# Patient Record
Sex: Female | Born: 1980 | Race: Black or African American | Hispanic: No | Marital: Married | Smoking: Current every day smoker
Health system: Southern US, Community
[De-identification: ages and names within clinical notes are randomized; demographics above are authoritative.]

## PROBLEM LIST (undated history)

## (undated) HISTORY — PX: TUBAL LIGATION: SHX77

---

## 2005-07-23 ENCOUNTER — Ambulatory Visit: Payer: Self-pay | Admitting: *Deleted

## 2005-07-30 ENCOUNTER — Ambulatory Visit (HOSPITAL_COMMUNITY): Admission: RE | Admit: 2005-07-30 | Discharge: 2005-07-30 | Payer: Self-pay | Admitting: *Deleted

## 2005-07-30 ENCOUNTER — Ambulatory Visit: Payer: Self-pay | Admitting: *Deleted

## 2005-08-13 ENCOUNTER — Ambulatory Visit: Payer: Self-pay | Admitting: Obstetrics & Gynecology

## 2005-08-26 ENCOUNTER — Ambulatory Visit: Payer: Self-pay | Admitting: *Deleted

## 2005-09-08 ENCOUNTER — Inpatient Hospital Stay (HOSPITAL_COMMUNITY): Admission: AD | Admit: 2005-09-08 | Discharge: 2005-09-08 | Payer: Self-pay | Admitting: Obstetrics & Gynecology

## 2005-09-08 ENCOUNTER — Ambulatory Visit: Payer: Self-pay | Admitting: Obstetrics & Gynecology

## 2005-09-10 ENCOUNTER — Ambulatory Visit: Payer: Self-pay | Admitting: *Deleted

## 2005-09-11 ENCOUNTER — Inpatient Hospital Stay (HOSPITAL_COMMUNITY): Admission: AD | Admit: 2005-09-11 | Discharge: 2005-09-14 | Payer: Self-pay | Admitting: Obstetrics & Gynecology

## 2005-09-11 ENCOUNTER — Ambulatory Visit: Payer: Self-pay | Admitting: Family Medicine

## 2011-01-19 ENCOUNTER — Encounter: Payer: Self-pay | Admitting: *Deleted

## 2012-05-21 ENCOUNTER — Other Ambulatory Visit (HOSPITAL_COMMUNITY): Payer: Self-pay | Admitting: Specialist

## 2012-05-27 ENCOUNTER — Ambulatory Visit (HOSPITAL_COMMUNITY)
Admission: RE | Admit: 2012-05-27 | Discharge: 2012-05-27 | Disposition: A | Discharge status: Home / Self Care | Payer: Medicaid Other | Source: Ambulatory Visit | Attending: Specialist | Admitting: Specialist

## 2012-05-27 ENCOUNTER — Encounter (HOSPITAL_COMMUNITY): Payer: Self-pay

## 2012-05-27 DIAGNOSIS — O358XX0 Maternal care for other (suspected) fetal abnormality and damage, not applicable or unspecified: Secondary | ICD-10-CM | POA: Insufficient documentation

## 2012-05-27 DIAGNOSIS — E669 Obesity, unspecified: Secondary | ICD-10-CM | POA: Insufficient documentation

## 2012-05-27 DIAGNOSIS — O30009 Twin pregnancy, unspecified number of placenta and unspecified number of amniotic sacs, unspecified trimester: Secondary | ICD-10-CM | POA: Insufficient documentation

## 2012-05-27 DIAGNOSIS — O139 Gestational [pregnancy-induced] hypertension without significant proteinuria, unspecified trimester: Secondary | ICD-10-CM | POA: Insufficient documentation

## 2012-05-27 NOTE — Progress Notes (Signed)
Patient seen today  for follow up ultrasound.  See full report in [facility].  Kimiya Brunelle, MD   

## 2013-08-10 IMAGING — US US OB COMP EACH ADDL GEST+14 WKS
1 series · 16 of 28 positions shown · non-contrast
Comparison: none

[Series 1: us ob comp each addl gest+14 wks · 0.28mm/px · 63 acquisitions, 16 frames shown]
[im 1/63]
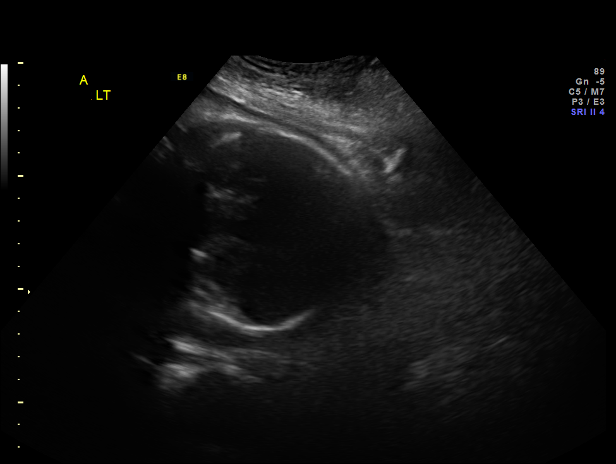
[im 5/63]
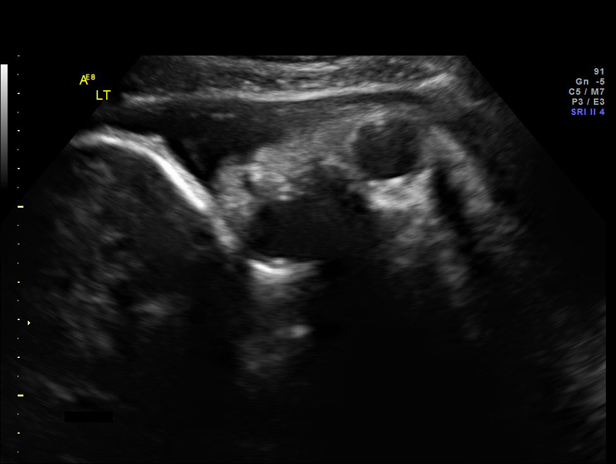
[im 10/63]
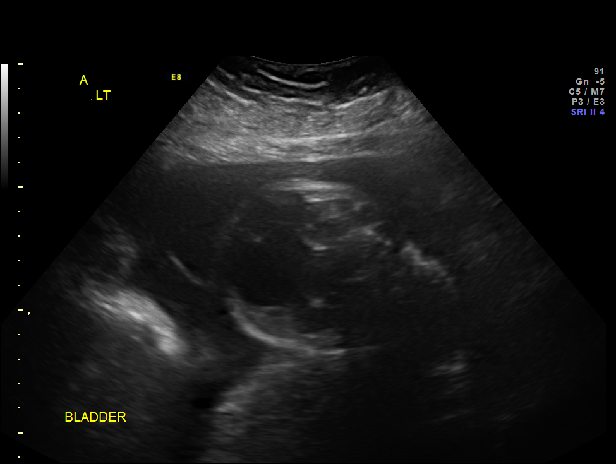
[im 14/63]
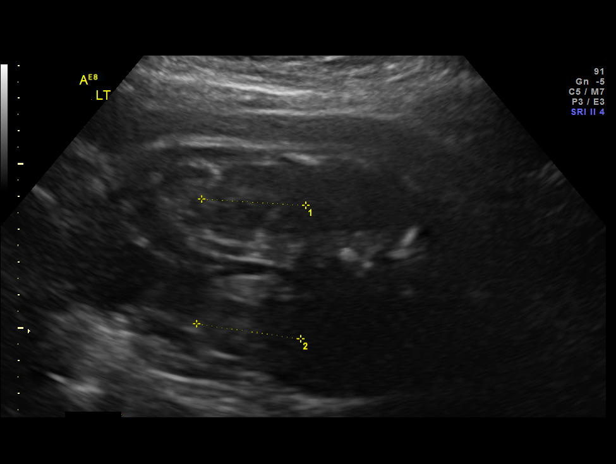
[im 17/63]
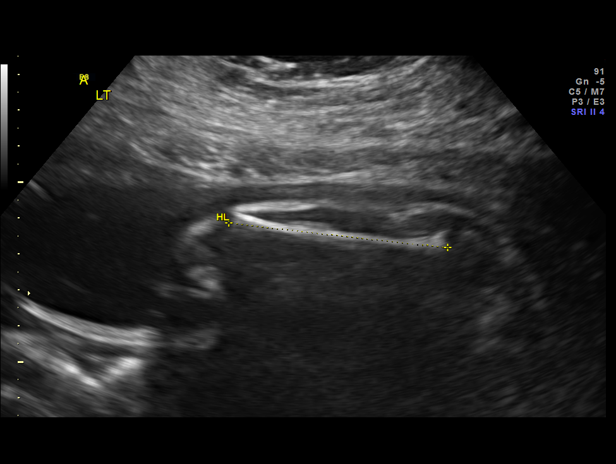
[im 21/63]
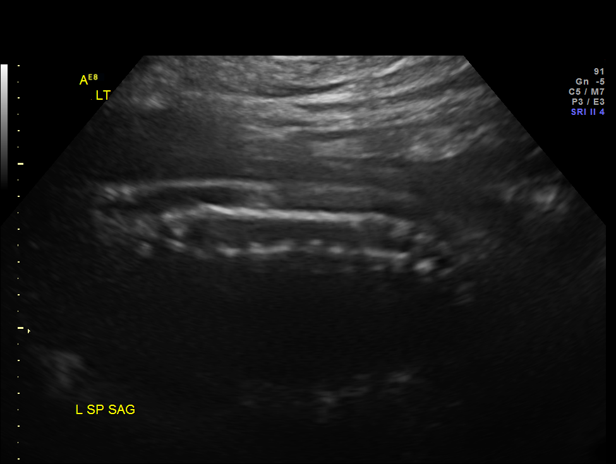
[im 26/63]
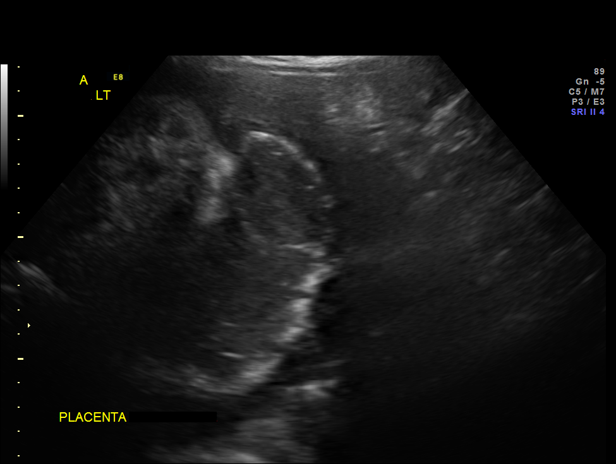
[im 30/63]
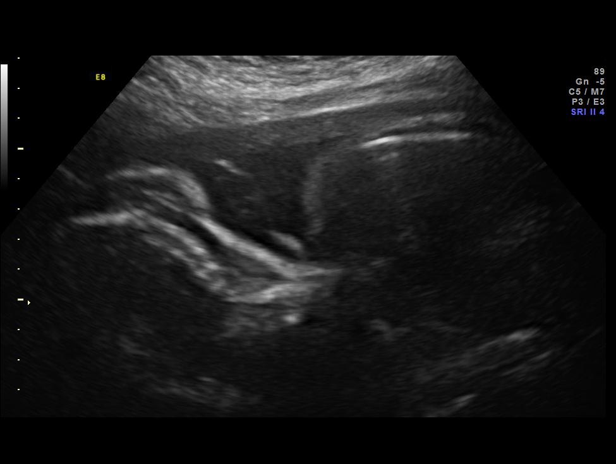
[im 33/63]
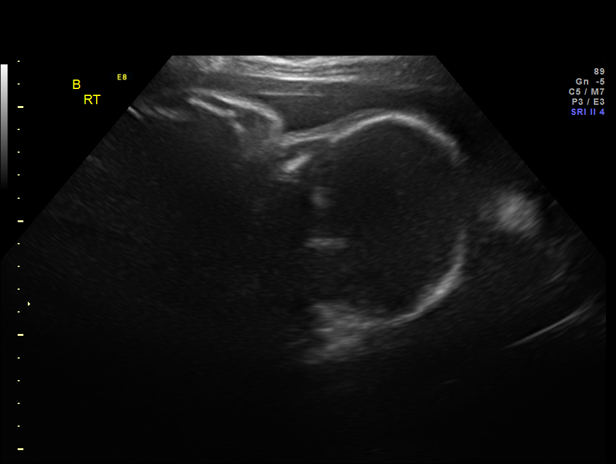
[im 37/63]
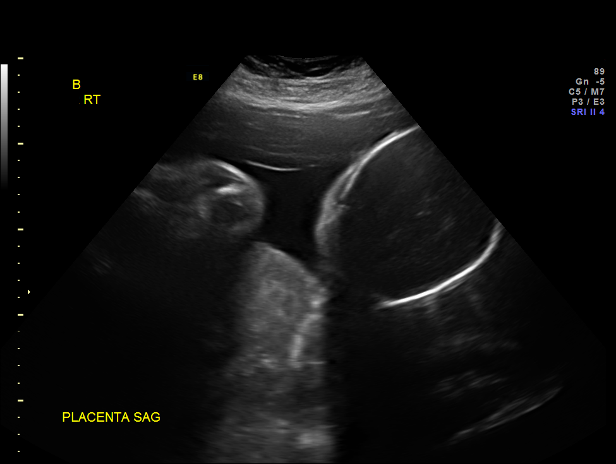
[im 42/63]
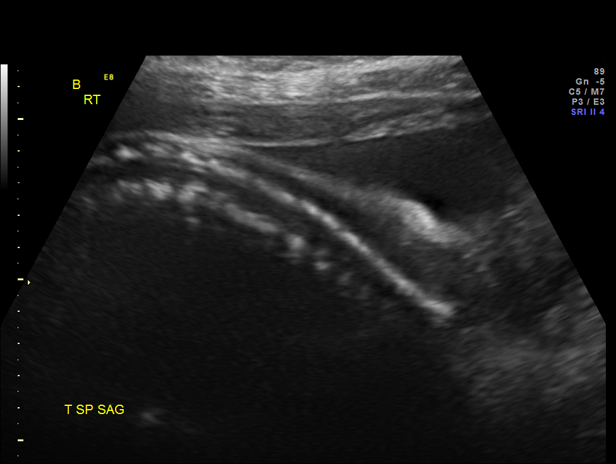
[im 46/63]
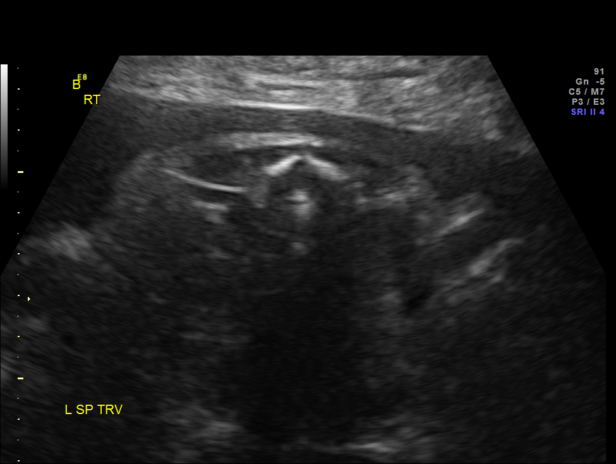
[im 49/63]
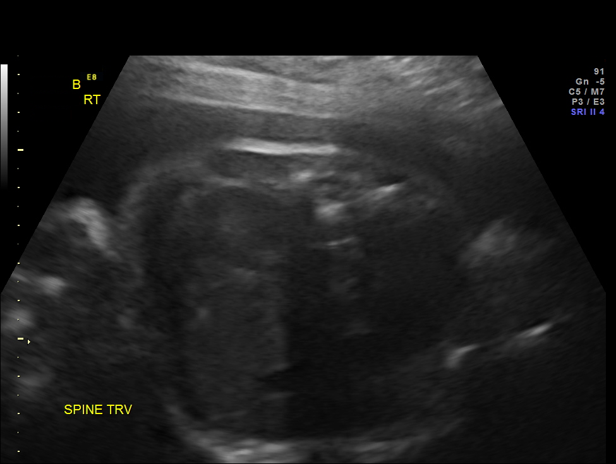
[im 53/63]
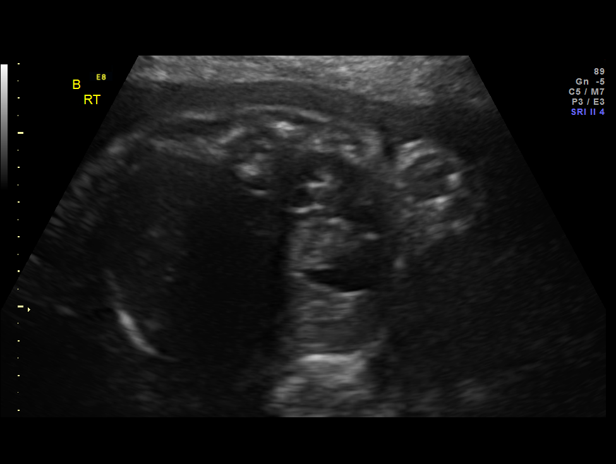
[im 58/63]
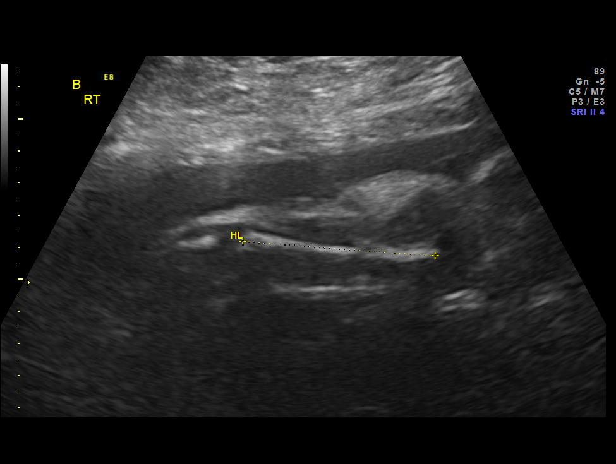
[im 63/63]
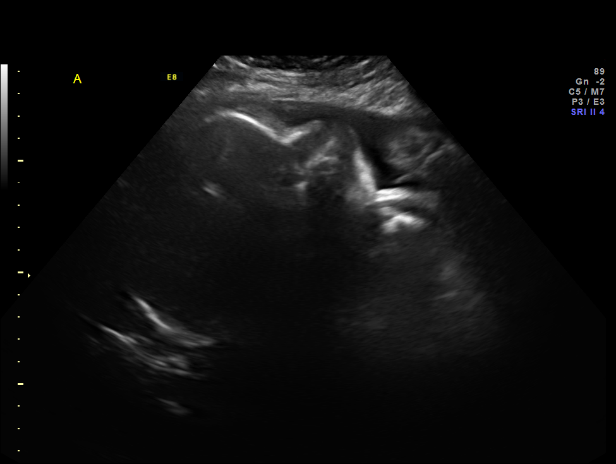

[16 of 28 positions shown; findings below may reference images not displayed]

OBSTETRICS REPORT
                      (Signed Final 05/27/2012 [DATE])

 Order#:         [id number]
                 75_O
Procedures

 US OB COMP + 14 WK                                    76805.1
 US OB COMP ADDL GEST + 14 WK                          76810.1
Indications

 Twin gestation, Di-Di
 Discordant Growth
 Hypertension - Gestational
 Obesity (376 lb)
 Cannabis abuse
 Cigarette smoker
 RH Negative
 Echogenic focus in heart (Twin A)
 Assess Fetal Growth / Estimated Fetal Weight
Fetal Evaluation (Fetus A)

 Fetal Heart Rate:  135                          bpm
 Cardiac Activity:  Observed
 Fetal Lie:         Lower leftFetus
 Presentation:      Cephalic
 Placenta:          Posterior

 Membrane Desc:     Dividing
                    Membrane seen
                    - Dichorionic.

 Amniotic Fluid
 AFI FV:      Subjectively within normal limits
                                             Larg Pckt:     5.0  cm
Biometry (Fetus A)

 BPD:     82.1  mm     G. Age:  33w 0d                CI:         76.1   70 - 86
 OFD:    107.9  mm                                    FL/HC:      21.0   20.1 -

 HC:     307.3  mm     G. Age:  34w 2d        9  %    HC/AC:      1.04   0.93 -

 AC:       295  mm     G. Age:  33w 4d       20  %    FL/BPD:     78.7   71 - 87
 FL:      64.6  mm     G. Age:  33w 2d       10  %    FL/AC:      21.9   20 - 24
 HUM:     60.6  mm     G. Age:  35w 1d       70  %

 Est. FW:    8823  gm    4 lb 14 oz      34  %     FW Discordancy         9  %
Gestational Age (Fetus A)

 LMP:           34w 6d        Date:  09/26/11                 EDD:   07/02/12
 U/S Today:     33w 4d                                        EDD:   07/11/12
 Best:          34w 6d     Det. By:  LMP  (09/26/11)          EDD:   07/02/12
Anatomy (Fetus A)

 Cranium:           Appears normal      Stomach:           Appears
                                                           normal, left
                                                           sided
 Ventricles:        Appears normal      Abdomen:           Appears normal
 Nuchal Fold:       Not applicable      Cord Vessels:      Appears normal
                    (>20 wks GA)                           (3 vessel cord)
 Face:              Appears normal      Kidneys:           Appear normal
                    (lips/profile/orbit
                    s)
 Heart:             Not well            Bladder:           Appears normal
                    visualized
                    today, EIF prev
                    seen
 Diaphragm:         Appears normal      Spine:             Limited views
                                                           appear normal

 Other:     Technically difficult due to advanced GA and fetal
            position.

Fetal Evaluation (Fetus B)

 Fetal Heart Rate:  127                          bpm
 Cardiac Activity:  Observed
 Fetal Lie:         Upper right Fetus
 Presentation:      Cephalic
 Placenta:          Posterior

 Membrane Desc:     Dividing
                    Membrane seen
                    - Dichorionic.

 Amniotic Fluid
 AFI FV:      Subjectively within normal limits
                                             Larg Pckt:     5.7  cm
Biometry (Fetus B)

 BPD:     85.9  mm     G. Age:  34w 5d                CI:         77.9   70 - 86
 OFD:    110.3  mm                                    FL/HC:      21.4   20.1 -

 HC:     312.6  mm     G. Age:  35w 0d       21  %    HC/AC:      1.03   0.93 -

 AC:     302.6  mm     G. Age:  34w 2d       38  %    FL/BPD:     77.8   71 - 87
 FL:      66.8  mm     G. Age:  34w 3d       30  %    FL/AC:      22.1   20 - 24
 HUM:       59  mm     G. Age:  34w 1d       52  %

 Est. FW:    8972  gm      5 lb 5 oz     51  %     FW Discordancy      0 \ 9 %
Gestational Age (Fetus B)

 LMP:           34w 6d        Date:  09/26/11                 EDD:   07/02/12
 U/S Today:     34w 4d                                        EDD:   07/04/12
 Best:          34w 6d     Det. By:  LMP  (09/26/11)          EDD:   07/02/12
Anatomy (Fetus B)

 Cranium:           Appears normal      Abdomen:           Appears normal
 Ventricles:        Appears normal      Cord Vessels:      Appears normal
                                                           (3 vessel cord)
 Face:              Not well            Kidneys:           Appear normal
                    visualized
 Heart:             Not well            Bladder:           Appears normal
                    visualized
 Stomach:           Appears             Spine:             Appears normal
                    normal, left
                    sided

 Other:     Technically difficult due to advanced GA and fetal
            position.
Cervix Uterus Adnexa

 Cervix:       Not visualized (advanced GA >29 wks)
Impression

 DC/DA twins with best dates of 34 [DATE] weeks.  Fetal growth is
 appropriate and concordant (9% growth discordance noted).
 Normal amniotic fluid volume x 2

 A: maternal left, cephalic, posterior placenta.  EFW 8823 g
 (34th %)

 B: maternal right, cephalic, posterior placenta .  EFW 8972
 (51st %)
Recommendations

 Recommend follow up ultrasound as  as clinically indicated.

 questions or concerns.

## 2014-10-30 ENCOUNTER — Encounter (HOSPITAL_COMMUNITY): Payer: Self-pay

## 2017-10-17 ENCOUNTER — Emergency Department (HOSPITAL_BASED_OUTPATIENT_CLINIC_OR_DEPARTMENT_OTHER)
Admission: EM | Admit: 2017-10-17 | Discharge: 2017-10-17 | Disposition: A | Discharge status: Home / Self Care | Payer: Self-pay | Attending: Emergency Medicine | Admitting: Emergency Medicine

## 2017-10-17 ENCOUNTER — Encounter (HOSPITAL_BASED_OUTPATIENT_CLINIC_OR_DEPARTMENT_OTHER): Payer: Self-pay | Admitting: Emergency Medicine

## 2017-10-17 ENCOUNTER — Emergency Department (HOSPITAL_BASED_OUTPATIENT_CLINIC_OR_DEPARTMENT_OTHER): Payer: Self-pay

## 2017-10-17 DIAGNOSIS — Z79899 Other long term (current) drug therapy: Secondary | ICD-10-CM | POA: Insufficient documentation

## 2017-10-17 DIAGNOSIS — B349 Viral infection, unspecified: Secondary | ICD-10-CM | POA: Insufficient documentation

## 2017-10-17 DIAGNOSIS — F17210 Nicotine dependence, cigarettes, uncomplicated: Secondary | ICD-10-CM | POA: Insufficient documentation

## 2017-10-17 MED ORDER — PROMETHAZINE-DM 6.25-15 MG/5ML PO SYRP: 5.0000 mL | ORAL_SOLUTION | Freq: Four times a day (QID) | ORAL | 0 refills | Status: AC | PRN

## 2017-10-17 MED ORDER — HYDROCODONE-ACETAMINOPHEN 7.5-325 MG/15ML PO SOLN
10.0000 mL | Freq: Once | ORAL | Status: AC
Start: 2017-10-17 — End: 2017-10-17
  Administered 2017-10-17: 10 mL via ORAL
  Filled 2017-10-17: qty 15

## 2017-10-17 NOTE — Discharge Instructions (Signed)
Rest. Push fluids. Motrin or Tylenol for body aches. Phenergan-DM for cough. This can cause sleepiness.

## 2017-10-17 NOTE — ED Triage Notes (Signed)
PT presents with c/o cold and flu like symptoms for the past 2 days.

## 2017-10-17 NOTE — ED Provider Notes (Signed)
[facility] EMERGENCY DEPARTMENT Provider Note   CSN: [id number] Arrival date & time: 10/17/17  1624     History   Chief Complaint Chief Complaint  Patient presents with  . Influenza    HPI Michelle Baldwin is a 36 y.o. female. Chief complaint is cough, headache, chest tightness, fever.  HPI 36 year old female. Symptoms started 2 days ago. Cough. Headache. That she feels "hot" but checked temperature at home and found it normal. Presents here for evaluation. No chest pain. Short of breath only with cough. Dry nonproductive. No hemoptysis. Does have muscle aches. No sinus pain or pressure. No ear pain. No GI complaints. Diffuse myalgias.  History reviewed. No pertinent past medical history.  There are no active problems to display for this patient.   History reviewed. No pertinent surgical history.  OB History    Gravida Para Term Preterm AB Living   3 2       2    SAB TAB Ectopic Multiple Live Births                   Home Medications    Prior to Admission medications   Medication Sig Start Date End Date Taking? Authorizing Provider  methyldopa (ALDOMET) 500 MG tablet Take 500 mg by mouth 2 (two) times daily.    [provider]  promethazine-dextromethorphan (PROMETHAZINE-DM) 6.25-15 MG/5ML syrup Take 5 mLs by mouth 4 (four) times daily as needed for cough. 10/17/17   Rolland PorterJames, Reginold Beale, MD    Family History No family history on file.  Social History Social History  Substance Use Topics  . Smoking status: Current Every Day Smoker  . Smokeless tobacco: Never Used  . Alcohol use No     Allergies   Patient has no known allergies.   Review of Systems Review of Systems  Constitutional: Positive for fever. Negative for appetite change, chills, diaphoresis and fatigue.  HENT: Negative for mouth sores, sore throat and trouble swallowing.   Eyes: Negative for visual disturbance.  Respiratory: Positive for cough. Negative for chest tightness,  shortness of breath and wheezing.   Cardiovascular: Negative for chest pain.  Gastrointestinal: Negative for abdominal distention, abdominal pain, diarrhea, nausea and vomiting.  Endocrine: Negative for polydipsia, polyphagia and polyuria.  Genitourinary: Negative for dysuria, frequency and hematuria.  Musculoskeletal: Positive for myalgias. Negative for gait problem.  Skin: Negative for color change, pallor and rash.  Neurological: Positive for headaches. Negative for dizziness, syncope and light-headedness.  Hematological: Does not bruise/bleed easily.  Psychiatric/Behavioral: Negative for behavioral problems and confusion.     Physical Exam Updated Vital Signs BP 127/72   Pulse 83   Temp 98.6 F (37 C) (Oral)   Resp 18   LMP 10/10/2017   SpO2 95%   Breastfeeding? Unknown   Physical Exam  Constitutional: She is oriented to person, place, and time. She appears well-developed and well-nourished. No distress.  Awake and alert. No distress. Frequent cough. No dyspnea conversation.  HENT:  Head: Normocephalic.  Normal pharynx. TMs normal. No adenopathy in the neck.  Eyes: Pupils are equal, round, and reactive to light. Conjunctivae are normal. No scleral icterus.  Neck: Normal range of motion. Neck supple. No thyromegaly present.  Cardiovascular: Normal rate and regular rhythm.  Exam reveals no gallop and no friction rub.   No murmur heard. Pulmonary/Chest: Effort normal and breath sounds normal. No respiratory distress. She has no wheezes. She has no rales.  Clear breath sounds. Frequent cough.  Abdominal: Soft. Bowel  sounds are normal. She exhibits no distension. There is no tenderness. There is no rebound.  Musculoskeletal: Normal range of motion.  Neurological: She is alert and oriented to person, place, and time.  Skin: Skin is warm and dry. No rash noted.  Psychiatric: She has a normal mood and affect. Her behavior is normal.     ED Treatments / Results  Labs (all  labs ordered are listed, but only abnormal results are displayed) Labs Reviewed - No data to display  EKG  EKG Interpretation None       Radiology Dg Chest 2 View  Result Date: 10/17/2017 CLINICAL DATA:  Cough and fever EXAM: CHEST  2 VIEW COMPARISON:  None. FINDINGS: The heart size and mediastinal contours are within normal limits. Both lungs are clear. The visualized skeletal structures are unremarkable. IMPRESSION: No active cardiopulmonary disease. Electronically Signed   By: Alcide Clever M.D.   On: 10/17/2017 19:32    Procedures Procedures (including critical care time)  Medications Ordered in ED Medications  HYDROcodone-acetaminophen (HYCET) 7.5-325 mg/15 ml solution 10 mL (10 mLs Oral Given 10/17/17 1926)     Initial Impression / Assessment and Plan / ED Course  I have reviewed the triage vital signs and the nursing notes.  Pertinent labs & imaging results that were available during my care of the patient were reviewed by me and considered in my medical decision making (see chart for details).   normal chest x-ray. Not hypoxemic. Clear lungs. No symptoms or findings to suggest acute upper to bacterial infection. Plan will be symptomatic treatment.    Final Clinical Impressions(s) / ED Diagnoses   Final diagnoses:  Viral syndrome    New Prescriptions Discharge Medication List as of 10/17/2017  8:41 PM    START taking these medications   Details  promethazine-dextromethorphan (PROMETHAZINE-DM) 6.25-15 MG/5ML syrup Take 5 mLs by mouth 4 (four) times daily as needed for cough., Starting Sat 10/17/2017, Print         Rolland Porter, MD 10/17/17 [phone]

## 2020-03-16 ENCOUNTER — Other Ambulatory Visit: Payer: Self-pay

## 2020-03-16 ENCOUNTER — Emergency Department (HOSPITAL_BASED_OUTPATIENT_CLINIC_OR_DEPARTMENT_OTHER)
Admission: EM | Admit: 2020-03-16 | Discharge: 2020-03-16 | Disposition: A | Discharge status: Home / Self Care | Payer: BLUE CROSS/BLUE SHIELD | Attending: Emergency Medicine | Admitting: Emergency Medicine

## 2020-03-16 ENCOUNTER — Encounter (HOSPITAL_BASED_OUTPATIENT_CLINIC_OR_DEPARTMENT_OTHER): Payer: Self-pay | Admitting: *Deleted

## 2020-03-16 DIAGNOSIS — Z79899 Other long term (current) drug therapy: Secondary | ICD-10-CM | POA: Insufficient documentation

## 2020-03-16 DIAGNOSIS — M545 Low back pain, unspecified: Secondary | ICD-10-CM

## 2020-03-16 DIAGNOSIS — F17210 Nicotine dependence, cigarettes, uncomplicated: Secondary | ICD-10-CM | POA: Diagnosis not present

## 2020-03-16 DIAGNOSIS — N390 Urinary tract infection, site not specified: Secondary | ICD-10-CM | POA: Insufficient documentation

## 2020-03-16 LAB — URINALYSIS, MICROSCOPIC (REFLEX)

## 2020-03-16 LAB — URINALYSIS, ROUTINE W REFLEX MICROSCOPIC
Bilirubin Urine: NEGATIVE
Glucose, UA: NEGATIVE mg/dL
Ketones, ur: NEGATIVE mg/dL
Leukocytes,Ua: NEGATIVE
Nitrite: NEGATIVE
Protein, ur: NEGATIVE mg/dL
Specific Gravity, Urine: >1.03 — ABNORMAL HIGH (ref 1.005–1.030)
pH: 5.5 (ref 5.0–8.0)

## 2020-03-16 MED ORDER — DICLOFENAC SODIUM 75 MG PO TBEC: 75.0000 mg | DELAYED_RELEASE_TABLET | Freq: Two times a day (BID) | ORAL | 0 refills | Status: DC

## 2020-03-16 MED ORDER — CEPHALEXIN 500 MG PO CAPS: 500.0000 mg | ORAL_CAPSULE | Freq: Four times a day (QID) | ORAL | 0 refills | Status: AC

## 2020-03-16 NOTE — ED Triage Notes (Signed)
Back pain for a week. No known injury. No urinary complaints. She was recently evaluated by her MD for abnormal vaginal bleeding that has stopped since treatment.

## 2020-03-16 NOTE — ED Provider Notes (Signed)
[facility] EMERGENCY DEPARTMENT Provider Note   CSN: [id number] Arrival date & time: 03/16/20  1539     History Chief Complaint  Patient presents with  . Back Pain    Michelle Baldwin is a 39 y.o. female.  The history is provided by the patient. No language interpreter was used.  Back Pain Location:  Generalized Quality:  Aching Pain severity:  Moderate Onset quality:  Gradual Timing:  Constant Progression:  Worsening Chronicity:  New Context: not recent injury   Relieved by:  Nothing Worsened by:  Nothing Ineffective treatments:  None tried Associated symptoms: no fever    Pt complains of pain in her low back.  Pt reports pain is worse with a deep breath and with standing.  Pt denies any injury     History reviewed. No pertinent past medical history.  There are no problems to display for this patient.   History reviewed. No pertinent surgical history.   OB History    Gravida  3   Para  2   Term      Preterm      AB      Living  2     SAB      TAB      Ectopic      Multiple      Live Births              No family history on file.  Social History   Tobacco Use  . Smoking status: Current Every Day Smoker  . Smokeless tobacco: Never Used  Substance Use Topics  . Alcohol use: No  . Drug use: No    Home Medications Prior to Admission medications   Medication Sig Start Date End Date Taking? Authorizing Provider  cephALEXin (KEFLEX) 500 MG capsule Take 1 capsule (500 mg total) by mouth 4 (four) times daily for 10 days. 03/16/20 03/26/20  Elson Areas, PA-C  diclofenac (VOLTAREN) 75 MG EC tablet Take 1 tablet (75 mg total) by mouth 2 (two) times daily. 03/16/20   Elson Areas, PA-C  methyldopa (ALDOMET) 500 MG tablet Take 500 mg by mouth 2 (two) times daily.    [provider]  promethazine-dextromethorphan (PROMETHAZINE-DM) 6.25-15 MG/5ML syrup Take 5 mLs by mouth 4 (four) times daily as needed for cough. 10/17/17    Rolland Porter, MD    Allergies    Patient has no known allergies.  Review of Systems   Review of Systems  Constitutional: Negative for fever.  Musculoskeletal: Positive for back pain.  All other systems reviewed and are negative.   Physical Exam Updated Vital Signs BP 120/86   Pulse 73   Temp 98 F (36.7 C) (Oral)   Resp 18   Ht 5' 5.5" (1.664 m)   Wt (!) 170.6 kg   SpO2 100%   BMI 61.62 kg/m   Physical Exam Vitals and nursing note reviewed.  Constitutional:      Appearance: She is well-developed.  HENT:     Head: Normocephalic.  Cardiovascular:     Rate and Rhythm: Normal rate.  Pulmonary:     Effort: Pulmonary effort is normal.  Abdominal:     General: There is no distension.  Musculoskeletal:        General: Normal range of motion.     Cervical back: Normal range of motion.     Comments: Tender bilat flank area,  ls spine nontender   Skin:    General: Skin is  warm.  Neurological:     Mental Status: She is alert and oriented to person, place, and time.  Psychiatric:        Mood and Affect: Mood normal.     ED Results / Procedures / Treatments   Labs (all labs ordered are listed, but only abnormal results are displayed) Labs Reviewed  URINALYSIS, ROUTINE W REFLEX MICROSCOPIC - Abnormal; Notable for the following components:      Result Value   Specific Gravity, Urine >1.030 (*)    Hgb urine dipstick TRACE (*)    All other components within normal limits  URINALYSIS, MICROSCOPIC (REFLEX) - Abnormal; Notable for the following components:   Bacteria, UA MANY (*)    All other components within normal limits    EKG None  Radiology No results found.  Procedures Procedures (including critical care time)  Medications Ordered in ED Medications - No data to display  ED Course  I have reviewed the triage vital signs and the nursing notes.  Pertinent labs & imaging results that were available during my care of the patient were reviewed by me and  considered in my medical decision making (see chart for details).    MDM Rules/Calculators/A&P                      MDM Ua shows bacteri and wbc's  Pt given rx for diflucan and keflex Final Clinical Impression(s) / ED Diagnoses Final diagnoses:  Acute bilateral low back pain, unspecified whether sciatica present  Urinary tract infection without hematuria, site unspecified    Rx / DC Orders ED Discharge Orders         Ordered    diclofenac (VOLTAREN) 75 MG EC tablet  2 times daily     03/16/20 1635    cephALEXin (KEFLEX) 500 MG capsule  4 times daily     03/16/20 1635        An After Visit Summary was printed and given to the patient.    Fransico Meadow, [place] 03/16/20 1643    Dorie Rank, MD 03/17/20 1213

## 2020-03-16 NOTE — Discharge Instructions (Addendum)
Return if any problems.

## 2020-03-31 ENCOUNTER — Emergency Department (HOSPITAL_BASED_OUTPATIENT_CLINIC_OR_DEPARTMENT_OTHER)
Admission: EM | Admit: 2020-03-31 | Discharge: 2020-03-31 | Disposition: A | Discharge status: Home / Self Care | Payer: BLUE CROSS/BLUE SHIELD | Attending: Emergency Medicine | Admitting: Emergency Medicine

## 2020-03-31 ENCOUNTER — Other Ambulatory Visit: Payer: Self-pay

## 2020-03-31 ENCOUNTER — Encounter (HOSPITAL_BASED_OUTPATIENT_CLINIC_OR_DEPARTMENT_OTHER): Payer: Self-pay

## 2020-03-31 DIAGNOSIS — A599 Trichomoniasis, unspecified: Secondary | ICD-10-CM | POA: Diagnosis not present

## 2020-03-31 DIAGNOSIS — R202 Paresthesia of skin: Secondary | ICD-10-CM | POA: Insufficient documentation

## 2020-03-31 DIAGNOSIS — F17290 Nicotine dependence, other tobacco product, uncomplicated: Secondary | ICD-10-CM | POA: Insufficient documentation

## 2020-03-31 DIAGNOSIS — Z79899 Other long term (current) drug therapy: Secondary | ICD-10-CM | POA: Diagnosis not present

## 2020-03-31 DIAGNOSIS — M545 Low back pain, unspecified: Secondary | ICD-10-CM

## 2020-03-31 DIAGNOSIS — R300 Dysuria: Secondary | ICD-10-CM | POA: Diagnosis not present

## 2020-03-31 LAB — URINALYSIS, ROUTINE W REFLEX MICROSCOPIC
Bilirubin Urine: NEGATIVE
Glucose, UA: NEGATIVE mg/dL
Hgb urine dipstick: NEGATIVE
Ketones, ur: NEGATIVE mg/dL
Nitrite: NEGATIVE
Protein, ur: NEGATIVE mg/dL
Specific Gravity, Urine: >1.03 — ABNORMAL HIGH (ref 1.005–1.030)
pH: 6 (ref 5.0–8.0)

## 2020-03-31 LAB — URINALYSIS, MICROSCOPIC (REFLEX)

## 2020-03-31 LAB — PREGNANCY, URINE: Preg Test, Ur: NEGATIVE

## 2020-03-31 MED ORDER — DICLOFENAC SODIUM 75 MG PO TBEC: 75.0000 mg | DELAYED_RELEASE_TABLET | Freq: Two times a day (BID) | ORAL | 0 refills | Status: DC

## 2020-03-31 MED ORDER — CYCLOBENZAPRINE HCL 10 MG PO TABS
10.0000 mg | ORAL_TABLET | Freq: Three times a day (TID) | ORAL | 0 refills | Status: AC | PRN
Start: 2020-03-31 — End: ?

## 2020-03-31 MED ORDER — METRONIDAZOLE 500 MG PO TABS: 500.0000 mg | ORAL_TABLET | Freq: Two times a day (BID) | ORAL | 0 refills | Status: DC

## 2020-03-31 NOTE — ED Triage Notes (Signed)
Pt states c/o back pain states that she was recently seen and treated here for a UTI, reports taking all of her medications, states that about 2 days after finishing them her back started hurting again, denies any urinary symptoms.

## 2020-03-31 NOTE — ED Notes (Signed)
ED Provider at bedside. 

## 2020-03-31 NOTE — Discharge Instructions (Signed)
You were seen for recurrence of your low back pain.  You had a urinalysis that showed a possible urine infection but also was positive for trichomonas which is a sexually transmitted disease.  We are treating you with a different antibiotic for that and continuing to use anti-inflammatories and adding a muscle relaxant to help with your back.  Please use condoms for protection of STDs.  Follow-up with your doctor and return to the emergency department if any worsening symptoms.

## 2020-03-31 NOTE — ED Provider Notes (Signed)
[facility] EMERGENCY DEPARTMENT Provider Note   CSN: [id number] Arrival date & time: 03/31/20  1628     History Chief Complaint  Patient presents with  . Back Pain    Michelle Baldwin is a 39 y.o. female.  She was here a few weeks ago for low back pain and treated with diclofenac and antibiotics for possible urine infection.  She says her symptoms improved on medications but they stopped about 4 5 days ago and her pain is back.  Worse with bending and twisting.  No fevers chills no nausea vomiting.  No numbness or weakness.  No bowel or bladder incontinence.  She said she had a 1 episode of dysuria today.  Last menstrual period was a few weeks ago.  Works in [employer].  No known trauma.   Back Pain Location:  Lumbar spine Quality:  Aching Radiates to:  Does not radiate Pain severity:  Moderate Pain is:  Same all the time Onset quality:  Sudden Timing:  Intermittent Progression:  Unchanged Chronicity:  New Context: not falling and not occupational injury   Relieved by:  Nothing Worsened by:  Bending and twisting Ineffective treatments:  None tried Associated symptoms: dysuria and tingling (left hand few days)   Associated symptoms: no abdominal pain, no bladder incontinence, no bowel incontinence, no chest pain, no fever, no leg pain, no numbness and no weakness   Risk factors: obesity   Risk factors: no steroid use        History reviewed. No pertinent past medical history.  There are no problems to display for this patient.   History reviewed. No pertinent surgical history.   OB History    Gravida  3   Para  2   Term      Preterm      AB      Living  2     SAB      TAB      Ectopic      Multiple      Live Births              No family history on file.  Social History   Tobacco Use  . Smoking status: Current Every Day Smoker    Types: Cigars  . Smokeless tobacco: Never Used  Substance Use Topics  . Alcohol use: No  . Drug  use: No    Home Medications Prior to Admission medications   Medication Sig Start Date End Date Taking? Authorizing Provider  diclofenac (VOLTAREN) 75 MG EC tablet Take 1 tablet (75 mg total) by mouth 2 (two) times daily. 03/16/20   Fransico Meadow, PA-C  methyldopa (ALDOMET) 500 MG tablet Take 500 mg by mouth 2 (two) times daily.    [provider]  promethazine-dextromethorphan (PROMETHAZINE-DM) 6.25-15 MG/5ML syrup Take 5 mLs by mouth 4 (four) times daily as needed for cough. 10/17/17   Tanna Furry, MD    Allergies    Patient has no known allergies.  Review of Systems   Review of Systems  Constitutional: Negative for fever.  HENT: Negative for sore throat.   Eyes: Negative for visual disturbance.  Respiratory: Negative for shortness of breath.   Cardiovascular: Negative for chest pain.  Gastrointestinal: Negative for abdominal pain and bowel incontinence.  Genitourinary: Positive for dysuria. Negative for bladder incontinence and vaginal bleeding.  Musculoskeletal: Positive for back pain. Negative for neck pain.  Skin: Negative for rash.  Neurological: Positive for tingling (left hand few days).  Negative for weakness and numbness.    Physical Exam Updated Vital Signs BP (!) 141/99 (BP Location: Right Wrist)   Pulse 69   Temp 98.4 F (36.9 C) (Oral)   Resp 18   Ht 5\' 4"  (1.626 m)   Wt (!) 166 kg   LMP 03/19/2020   SpO2 100%   BMI 62.82 kg/m   Physical Exam Vitals and nursing note reviewed.  Constitutional:      General: She is not in acute distress.    Appearance: She is well-developed. She is obese.  HENT:     Head: Normocephalic and atraumatic.  Eyes:     Conjunctiva/sclera: Conjunctivae normal.  Cardiovascular:     Rate and Rhythm: Normal rate and regular rhythm.     Heart sounds: No murmur.  Pulmonary:     Effort: Pulmonary effort is normal. No respiratory distress.     Breath sounds: Normal breath sounds.  Abdominal:     Palpations: Abdomen is  soft.     Tenderness: There is no abdominal tenderness.  Musculoskeletal:        General: Tenderness present. No deformity.     Cervical back: Neck supple.     Comments: She is nontender cervical or thoracic spine.  No particular midline lumbar tenderness but she has paralumbar tenderness.  Skin:    General: Skin is warm and dry.     Capillary Refill: Capillary refill takes less than 2 seconds.  Neurological:     General: No focal deficit present.     Mental Status: She is alert.     Sensory: No sensory deficit.     Motor: No weakness.     Gait: Gait normal.     ED Results / Procedures / Treatments   Labs (all labs ordered are listed, but only abnormal results are displayed) Labs Reviewed  URINALYSIS, ROUTINE W REFLEX MICROSCOPIC - Abnormal; Notable for the following components:      Result Value   APPearance CLOUDY (*)    Specific Gravity, Urine >1.030 (*)    Leukocytes,Ua TRACE (*)    All other components within normal limits  URINALYSIS, MICROSCOPIC (REFLEX) - Abnormal; Notable for the following components:   Bacteria, UA MANY (*)    Trichomonas, UA PRESENT (*)    All other components within normal limits  URINE CULTURE  PREGNANCY, URINE    EKG None  Radiology No results found.  Procedures Procedures (including critical care time)  Medications Ordered in ED Medications - No data to display  ED Course  I have reviewed the triage vital signs and the nursing notes.  Pertinent labs & imaging results that were available during my care of the patient were reviewed by me and considered in my medical decision making (see chart for details).  Clinical Course as of Apr 01 1002  Sat Mar 31, 2020  1653 Patient here with a few days of paralumbar back pain.  Recently treated for a possible UTI.  Reviewed that urinalysis and it was not very impressive.  Was not sent for culture.  Will reacquire urinalysis and pregnancy test.  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  Likely NSAIDs and muscle relaxants.   [MB]  1744 Urinalysis showing 6-10 whites and bacteria although a 6-10 squames.  She is positive for trichomonas and sperm.  We will treat for trichomonas and have them culture the urine.   [MB]    Clinical Course  User Index [MB] Terrilee Files, MD   MDM Rules/Calculators/A&P                       Final Clinical Impression(s) / ED Diagnoses Final diagnoses:  Acute bilateral low back pain without sciatica  Trichomonal infection    Rx / DC Orders ED Discharge Orders         Ordered    diclofenac (VOLTAREN) 75 MG EC tablet  2 times daily     03/31/20 1746    cyclobenzaprine (FLEXERIL) 10 MG tablet  3 times daily PRN     03/31/20 1746    metroNIDAZOLE (FLAGYL) 500 MG tablet  2 times daily     03/31/20 1746           Terrilee Files, MD 04/01/20 1004

## 2020-03-31 NOTE — ED Notes (Signed)
Dr. Butler ED Provider at bedside. 

## 2020-04-02 LAB — URINE CULTURE

## 2021-05-10 ENCOUNTER — Encounter (HOSPITAL_BASED_OUTPATIENT_CLINIC_OR_DEPARTMENT_OTHER): Payer: Self-pay | Admitting: *Deleted

## 2021-05-10 ENCOUNTER — Other Ambulatory Visit: Payer: Self-pay

## 2021-05-10 ENCOUNTER — Emergency Department (HOSPITAL_BASED_OUTPATIENT_CLINIC_OR_DEPARTMENT_OTHER)
Admission: EM | Admit: 2021-05-10 | Discharge: 2021-05-10 | Disposition: A | Discharge status: Home / Self Care | Payer: BLUE CROSS/BLUE SHIELD | Attending: Emergency Medicine | Admitting: Emergency Medicine

## 2021-05-10 DIAGNOSIS — F17290 Nicotine dependence, other tobacco product, uncomplicated: Secondary | ICD-10-CM | POA: Diagnosis not present

## 2021-05-10 DIAGNOSIS — M79652 Pain in left thigh: Secondary | ICD-10-CM | POA: Diagnosis not present

## 2021-05-10 DIAGNOSIS — M25532 Pain in left wrist: Secondary | ICD-10-CM | POA: Diagnosis not present

## 2021-05-10 DIAGNOSIS — R52 Pain, unspecified: Secondary | ICD-10-CM

## 2021-05-10 DIAGNOSIS — M79602 Pain in left arm: Secondary | ICD-10-CM | POA: Insufficient documentation

## 2021-05-10 DIAGNOSIS — M79651 Pain in right thigh: Secondary | ICD-10-CM | POA: Insufficient documentation

## 2021-05-10 DIAGNOSIS — M79604 Pain in right leg: Secondary | ICD-10-CM | POA: Diagnosis not present

## 2021-05-10 LAB — CBC WITH DIFFERENTIAL/PLATELET
Abs Immature Granulocytes: 0.01 10*3/uL (ref 0.00–0.07)
Basophils Absolute: 0 10*3/uL (ref 0.0–0.1)
Basophils Relative: 1 %
Eosinophils Absolute: 0.2 10*3/uL (ref 0.0–0.5)
Eosinophils Relative: 3 %
HCT: 33.2 % — ABNORMAL LOW (ref 36.0–46.0)
Hemoglobin: 11.7 g/dL — ABNORMAL LOW (ref 12.0–15.0)
Immature Granulocytes: 0 %
Lymphocytes Relative: 34 %
Lymphs Abs: 2.2 10*3/uL (ref 0.7–4.0)
MCH: 26.8 pg (ref 26.0–34.0)
MCHC: 35.2 g/dL (ref 30.0–36.0)
MCV: 76.1 fL — ABNORMAL LOW (ref 80.0–100.0)
Monocytes Absolute: 0.6 10*3/uL (ref 0.1–1.0)
Monocytes Relative: 9 %
Neutro Abs: 3.5 10*3/uL (ref 1.7–7.7)
Neutrophils Relative %: 53 %
Platelets: 338 10*3/uL (ref 150–400)
RBC: 4.36 MIL/uL (ref 3.87–5.11)
RDW: 14.2 % (ref 11.5–15.5)
WBC: 6.5 10*3/uL (ref 4.0–10.5)
nRBC: 0 % (ref 0.0–0.2)

## 2021-05-10 LAB — COMPREHENSIVE METABOLIC PANEL
ALT: 37 U/L (ref 0–44)
AST: 42 U/L — ABNORMAL HIGH (ref 15–41)
Albumin: 3.1 g/dL — ABNORMAL LOW (ref 3.5–5.0)
Alkaline Phosphatase: 58 U/L (ref 38–126)
Anion gap: 6 (ref 5–15)
BUN: 14 mg/dL (ref 6–20)
CO2: 22 mmol/L (ref 22–32)
Calcium: 8.3 mg/dL — ABNORMAL LOW (ref 8.9–10.3)
Chloride: 110 mmol/L (ref 98–111)
Creatinine, Ser: 0.64 mg/dL (ref 0.44–1.00)
GFR, Estimated: >60 mL/min (ref >60)
Glucose, Bld: 91 mg/dL (ref 70–99)
Potassium: 3.7 mmol/L (ref 3.5–5.1)
Sodium: 138 mmol/L (ref 135–145)
Total Bilirubin: 0.4 mg/dL (ref 0.3–1.2)
Total Protein: 6.4 g/dL — ABNORMAL LOW (ref 6.5–8.1)

## 2021-05-10 LAB — URINALYSIS, ROUTINE W REFLEX MICROSCOPIC
Bilirubin Urine: NEGATIVE
Glucose, UA: NEGATIVE mg/dL
Ketones, ur: NEGATIVE mg/dL
Leukocytes,Ua: NEGATIVE
Nitrite: NEGATIVE
Protein, ur: NEGATIVE mg/dL
Specific Gravity, Urine: 1.02 (ref 1.005–1.030)
pH: 6 (ref 5.0–8.0)

## 2021-05-10 LAB — URINALYSIS, MICROSCOPIC (REFLEX)

## 2021-05-10 LAB — PREGNANCY, URINE: Preg Test, Ur: NEGATIVE

## 2021-05-10 LAB — LIPASE, BLOOD: Lipase: 42 U/L (ref 11–51)

## 2021-05-10 MED ORDER — KETOROLAC TROMETHAMINE 30 MG/ML IJ SOLN
15.0000 mg | Freq: Once | INTRAMUSCULAR | Status: AC
  Administered 2021-05-10: 15 mg via INTRAVENOUS
  Filled 2021-05-10: qty 1

## 2021-05-10 MED ORDER — NAPROXEN 500 MG PO TABS: 500.0000 mg | ORAL_TABLET | Freq: Two times a day (BID) | ORAL | 0 refills | Status: AC

## 2021-05-10 MED ORDER — KETOROLAC TROMETHAMINE 30 MG/ML IJ SOLN: 30.0000 mg | Freq: Once | INTRAMUSCULAR | Status: DC

## 2021-05-10 NOTE — ED Notes (Signed)
Patient transported to CT 

## 2021-05-10 NOTE — ED Triage Notes (Signed)
General body aches x 3 days. Denies fever or cough.

## 2021-05-10 NOTE — ED Provider Notes (Signed)
[facility] EMERGENCY DEPARTMENT Provider Note   CSN: [id number] Arrival date & time: 05/10/21  1322     History Chief Complaint  Patient presents with  . Generalized Body Aches    Michelle Baldwin is a 40 y.o. female.  40 y/o female with no PMH presents to the ED with a chief complaint of left arm pain x 3 days and right leg pain x 2 days. She describes the left arm pain intermittent stabbing pain radiating from left wrist to the left elbow. Prior hx of tendonitis, used to wear a brace for symptomatic control however this occurred when she was working at the [employer].  She has been taking aleve for pain control without much improvement. Exacerbate with opening the car door, trying to put seatbelt on and her ADLs.  In addition, she has pain to BL legs especially when she has to stand described as 'cramping to the tights", this is exacerbated at nighttime.  She has not taken anything for this. She denies any falls, fever, no prior hx of blood clots.  No primary care physician on file.  The history is provided by the patient.       History reviewed. No pertinent past medical history.  There are no problems to display for this patient.   History reviewed. No pertinent surgical history.   OB History    Gravida  3   Para  2   Term      Preterm      AB      Living  2     SAB      IAB      Ectopic      Multiple      Live Births              No family history on file.  Social History   Tobacco Use  . Smoking status: Current Every Day Smoker    Types: Cigars  . Smokeless tobacco: Never Used  Substance Use Topics  . Alcohol use: No  . Drug use: No    Home Medications Prior to Admission medications   Medication Sig Start Date End Date Taking? Authorizing Provider  naproxen (NAPROSYN) 500 MG tablet Take 1 tablet (500 mg total) by mouth 2 (two) times daily for 5 days. 05/10/21 05/15/21 Yes Fiora Weill, Leonie Douglas, PA-C  cyclobenzaprine (FLEXERIL) 10 MG  tablet Take 1 tablet (10 mg total) by mouth 3 (three) times daily as needed for muscle spasms. 03/31/20   Terrilee Files, MD  methyldopa (ALDOMET) 500 MG tablet Take 500 mg by mouth 2 (two) times daily.    [provider]  metroNIDAZOLE (FLAGYL) 500 MG tablet Take 1 tablet (500 mg total) by mouth 2 (two) times daily. 03/31/20   Terrilee Files, MD  promethazine-dextromethorphan (PROMETHAZINE-DM) 6.25-15 MG/5ML syrup Take 5 mLs by mouth 4 (four) times daily as needed for cough. 10/17/17   Rolland Porter, MD    Allergies    Patient has no known allergies.  Review of Systems   Review of Systems  Constitutional: Negative for fever.  Respiratory: Negative for shortness of breath.   Cardiovascular: Negative for chest pain.  Gastrointestinal: Negative for abdominal pain and nausea.  Genitourinary: Negative for flank pain.  Musculoskeletal: Positive for arthralgias and myalgias.  Neurological: Negative for light-headedness and headaches.  All other systems reviewed and are negative.   Physical Exam Updated Vital Signs BP 130/75 (BP Location: Left Arm)   Pulse (!) 56  Temp 98.3 F (36.8 C) (Oral)   Resp 18   Ht 5\' 4"  (1.626 m)   Wt (!) 167.8 kg   SpO2 100%   BMI 63.50 kg/m   Physical Exam Vitals and nursing note reviewed.  Constitutional:      Appearance: Normal appearance. She is not ill-appearing.  HENT:     Head: Normocephalic and atraumatic.     Nose: Nose normal.     Mouth/Throat:     Mouth: Mucous membranes are moist.  Eyes:     Pupils: Pupils are equal, round, and reactive to light.  Cardiovascular:     Rate and Rhythm: Normal rate.  Pulmonary:     Effort: Pulmonary effort is normal.  Abdominal:     General: Abdomen is flat.  Musculoskeletal:     Left hand: Tenderness present. No swelling, deformity, lacerations or bony tenderness. Normal range of motion. Normal strength. Normal sensation. There is no disruption of two-point discrimination. Normal capillary  refill. Normal pulse.     Cervical back: Normal range of motion and neck supple.       Legs:  Skin:    General: Skin is warm and dry.  Neurological:     Mental Status: She is alert and oriented to person, place, and time.     ED Results / Procedures / Treatments   Labs (all labs ordered are listed, but only abnormal results are displayed) Labs Reviewed  URINALYSIS, ROUTINE W REFLEX MICROSCOPIC - Abnormal; Notable for the following components:      Result Value   Hgb urine dipstick TRACE (*)    All other components within normal limits  CBC WITH DIFFERENTIAL/PLATELET - Abnormal; Notable for the following components:   Hemoglobin 11.7 (*)    HCT 33.2 (*)    MCV 76.1 (*)    All other components within normal limits  COMPREHENSIVE METABOLIC PANEL - Abnormal; Notable for the following components:   Calcium 8.3 (*)    Total Protein 6.4 (*)    Albumin 3.1 (*)    AST 42 (*)    All other components within normal limits  URINALYSIS, MICROSCOPIC (REFLEX) - Abnormal; Notable for the following components:   Bacteria, UA FEW (*)    All other components within normal limits  PREGNANCY, URINE  LIPASE, BLOOD    EKG None  Radiology No results found.  Procedures Procedures   Medications Ordered in ED Medications  ketorolac (TORADOL) 30 MG/ML injection 15 mg (15 mg Intravenous Given 05/10/21 1443)    ED Course  I have reviewed the triage vital signs and the nursing notes.  Pertinent labs & imaging results that were available during my care of the patient were reviewed by me and considered in my medical decision making (see chart for details).  Clinical Course as of 05/10/21 1509  Fri May 10, 2021  1421 Bacteria, UA(!): FEW [JS]  1421 Hgb urine dipstick(!): TRACE [JS]  1431 Preg Test, Ur: NEGATIVE [JS]    Clinical Course User Index [JS] May 12, 2021, PA-C   MDM Rules/Calculators/A&P     Presents to the ED with a chief complaint of left wrist pain, that has been ongoing  for the past 3 days without any improvement with over-the-counter medication.  Does report a similar complaint in the past, when she was diagnosed with tendinitis, reports placing a brace with improvement in her symptoms eventually.  Unsure whether she followed up with orthopedics.  In addition, she does report cramping to bilateral thighs that have  been ongoing for the last couple of days.  Reports these are exacerbated at night.  She is currently employed under housekeeping, does report that hurt workload has increased in the last few days, having to climb "about 13 rooms yesterday ", reports repetitive use of bilateral hands with cleaning equipment.  She does not have any primary care, does have a GYN on record.  During primary evaluation patient is overall afebrile, blood pressure slightly elevated, no tachycardia or hypoxia noted.  She is not ill, nontoxic-appearing.  Lungs are clear to auscultation, evaluation, rest of exam is benign.  Evaluation of her left hand this pain with palpation along the dorsum aspect, with some increased swelling but with full range of motion, capillary refill is intact, no abnormalities.  Does report pain along lower extremities, especially the right thigh.  There is no pain with palpation of the lumbar spine at the midline region.  Compartments are soft throughout bilateral lower extremities.  She will be placed on a Velcro splint to help with pain along the left wrist.  The rotation of her blood work revealed a CBC without any leukocytosis, hemoglobin slightly decreased, but stable, without any prior labs for comparison.  UA with trace of hemoglobin, does report just finishing her last menstrual cycle.  Few bacteria.  Pregnancy test is negative.  CMP without any electrolyte derangement, current levels within normal limits.  AST slightly elevated, ALT is within normal limits.  Lipase level is normal.  Patient does report mild improvement after placement of Velcro splint  along with receiving Toradol medication.  We discussed follow-up with her PCP as needed.  She will also go home on a short course of anti-inflammatories, patient shows and agrees with management.  Return precautions discussed at length.   Portions of this note were generated with Scientist, clinical (histocompatibility and immunogenetics). Dictation errors may occur despite best attempts at proofreading.  Final Clinical Impression(s) / ED Diagnoses Final diagnoses:  Generalized body aches  Left wrist pain    Rx / DC Orders ED Discharge Orders         Ordered    naproxen (NAPROSYN) 500 MG tablet  2 times daily        05/10/21 1508           Claude Manges, PA-C 05/10/21 1509    Sabino Donovan, MD 05/10/21 1510

## 2021-05-10 NOTE — Discharge Instructions (Signed)
Your laboratory results are within normal limits today.  I provided a short prescription of anti-inflammatories to help with your hand pain, please take 1 tablet twice a day for the next 5 days.  Also apply ice, heat to help with your hand pain.

## 2021-11-27 ENCOUNTER — Emergency Department (HOSPITAL_BASED_OUTPATIENT_CLINIC_OR_DEPARTMENT_OTHER)
Admission: EM | Admit: 2021-11-27 | Discharge: 2021-11-27 | Disposition: A | Discharge status: Home / Self Care | Payer: BLUE CROSS/BLUE SHIELD | Attending: Student | Admitting: Student

## 2021-11-27 ENCOUNTER — Encounter (HOSPITAL_BASED_OUTPATIENT_CLINIC_OR_DEPARTMENT_OTHER): Payer: Self-pay

## 2021-11-27 ENCOUNTER — Other Ambulatory Visit: Payer: Self-pay

## 2021-11-27 DIAGNOSIS — M545 Low back pain, unspecified: Secondary | ICD-10-CM | POA: Insufficient documentation

## 2021-11-27 DIAGNOSIS — F17210 Nicotine dependence, cigarettes, uncomplicated: Secondary | ICD-10-CM | POA: Diagnosis not present

## 2021-11-27 DIAGNOSIS — A599 Trichomoniasis, unspecified: Secondary | ICD-10-CM | POA: Insufficient documentation

## 2021-11-27 DIAGNOSIS — H60501 Unspecified acute noninfective otitis externa, right ear: Secondary | ICD-10-CM | POA: Diagnosis not present

## 2021-11-27 DIAGNOSIS — R109 Unspecified abdominal pain: Secondary | ICD-10-CM | POA: Insufficient documentation

## 2021-11-27 LAB — URINALYSIS, ROUTINE W REFLEX MICROSCOPIC
Bilirubin Urine: NEGATIVE
Glucose, UA: NEGATIVE mg/dL
Ketones, ur: NEGATIVE mg/dL
Leukocytes,Ua: NEGATIVE
Nitrite: NEGATIVE
Protein, ur: NEGATIVE mg/dL
Specific Gravity, Urine: >=1.03 (ref 1.005–1.030)
pH: 5.5 (ref 5.0–8.0)

## 2021-11-27 LAB — WET PREP, GENITAL
Clue Cells Wet Prep HPF POC: NONE SEEN
Sperm: NONE SEEN
WBC, Wet Prep HPF POC: <10 (ref <10)
Yeast Wet Prep HPF POC: NONE SEEN

## 2021-11-27 LAB — PREGNANCY, URINE: Preg Test, Ur: NEGATIVE

## 2021-11-27 LAB — URINALYSIS, MICROSCOPIC (REFLEX)

## 2021-11-27 LAB — HIV ANTIBODY (ROUTINE TESTING W REFLEX): HIV Screen 4th Generation wRfx: NONREACTIVE

## 2021-11-27 MED ORDER — OFLOXACIN 0.3 % OT SOLN: 10.0000 [drp] | Freq: Every day | OTIC | 0 refills | Status: AC

## 2021-11-27 MED ORDER — METRONIDAZOLE 500 MG PO TABS
2000.0000 mg | ORAL_TABLET | Freq: Once | ORAL | Status: AC
  Administered 2021-11-27: 2000 mg via ORAL
  Filled 2021-11-27: qty 4

## 2021-11-27 NOTE — ED Provider Notes (Signed)
[facility] EMERGENCY DEPARTMENT Provider Note   CSN: [id number] Arrival date & time: 11/27/21  1327     History Chief Complaint  Patient presents with   Flank Pain   Otalgia    Michelle Baldwin is a 40 y.o. female who presents to the ED today with complaint of gradual onset, constant, achy, left lower back pain x 4 days. Pt states she is concerned she could have a UTI as she has also been experiencing some increased urination - she also admits to drinking "too many sodas" recently. Per chart review pt has been seen in the past for back pain - she does admit she has had issues in the past with same. She also complains of brown vaginal discharge for the past 2 weeks - she states that she was treated for trichomonas earlier this summer after her husband returned from jail. She states he subsequently went back and then returned home and they engaged in sexual intercourse. She denies any pelvic pain. PSHx tubal ligation. Denies fevers, chills, abdominal pain, nausea, vomiting, vaginal bleeding, dysuria, hematuria, or any other associated symptoms. Pt has been taking Azo pills and states her pain has somewhat subsided with same.   Pt also complains of right sided ear pain that began yesterday. She denies any difficulty hearing/hearing loss, discharge, sore throat, fevers.   The history is provided by the patient and medical records.      History reviewed. No pertinent past medical history.  There are no problems to display for this patient.   Past Surgical History:  Procedure Laterality Date   TUBAL LIGATION       OB History     Gravida  3   Para  2   Term      Preterm      AB      Living  2      SAB      IAB      Ectopic      Multiple      Live Births              No family history on file.  Social History   Tobacco Use   Smoking status: Every Day    Types: Cigars   Smokeless tobacco: Never  Vaping Use   Vaping Use: Never used  Substance  Use Topics   Alcohol use: No   Drug use: Yes    Types: Marijuana    Home Medications Prior to Admission medications   Medication Sig Start Date End Date Taking? Authorizing Provider  ofloxacin (FLOXIN) 0.3 % OTIC solution Place 10 drops into the right ear daily for 7 days. 11/27/21 12/04/21 Yes Ercole Georg, PA-C  cyclobenzaprine (FLEXERIL) 10 MG tablet Take 1 tablet (10 mg total) by mouth 3 (three) times daily as needed for muscle spasms. 03/31/20   Hayden Rasmussen, MD  methyldopa (ALDOMET) 500 MG tablet Take 500 mg by mouth 2 (two) times daily.    [provider]  metroNIDAZOLE (FLAGYL) 500 MG tablet Take 1 tablet (500 mg total) by mouth 2 (two) times daily. 03/31/20   Hayden Rasmussen, MD  promethazine-dextromethorphan (PROMETHAZINE-DM) 6.25-15 MG/5ML syrup Take 5 mLs by mouth 4 (four) times daily as needed for cough. 10/17/17   Tanna Furry, MD    Allergies    Patient has no known allergies.  Review of Systems   Review of Systems  Constitutional:  Negative for chills and fever.  Gastrointestinal:  Negative for  abdominal pain, nausea and vomiting.  Genitourinary:  Positive for frequency and vaginal discharge. Negative for difficulty urinating, dysuria, flank pain, hematuria, pelvic pain and vaginal bleeding.  Musculoskeletal:  Positive for back pain.  All other systems reviewed and are negative.  Physical Exam Updated Vital Signs BP (!) 144/87 (BP Location: Left Arm)   Pulse 78   Temp 98.5 F (36.9 C) (Oral)   Resp 18   Ht 5\' 4"  (1.626 m)   Wt (!) 170.6 kg   LMP 10/25/2021   SpO2 100%   BMI 64.54 kg/m   Physical Exam Vitals and nursing note reviewed.  Constitutional:      Appearance: She is obese. She is not ill-appearing.  HENT:     Head: Normocephalic and atraumatic.     Right Ear: Tympanic membrane normal. Swelling and tenderness present. No mastoid tenderness.     Left Ear: Tympanic membrane and external ear normal.  Eyes:     Conjunctiva/sclera:  Conjunctivae normal.  Cardiovascular:     Rate and Rhythm: Normal rate and regular rhythm.     Pulses: Normal pulses.  Pulmonary:     Effort: Pulmonary effort is normal.     Breath sounds: Normal breath sounds. No wheezing, rhonchi or rales.  Abdominal:     Tenderness: There is no abdominal tenderness. There is no right CVA tenderness, left CVA tenderness, guarding or rebound.  Musculoskeletal:     Comments: No C, T, or L midline spinal TTP. + mild left paralumbar musculature TTP. ROM intact to neck and back. Strength 5/5 to BUE and BLEs. Sensation intact throughout. 2+ PT pulses.   Skin:    General: Skin is warm and dry.     Coloration: Skin is not jaundiced.  Neurological:     Mental Status: She is alert.    ED Results / Procedures / Treatments   Labs (all labs ordered are listed, but only abnormal results are displayed) Labs Reviewed  WET PREP, GENITAL - Abnormal; Notable for the following components:      Result Value   Trich, Wet Prep PRESENT (*)    All other components within normal limits  URINALYSIS, ROUTINE W REFLEX MICROSCOPIC - Abnormal; Notable for the following components:   Hgb urine dipstick TRACE (*)    All other components within normal limits  URINALYSIS, MICROSCOPIC (REFLEX) - Abnormal; Notable for the following components:   Bacteria, UA RARE (*)    Trichomonas, UA PRESENT (*)    All other components within normal limits  PREGNANCY, URINE  RPR  HIV ANTIBODY (ROUTINE TESTING W REFLEX)  GC/CHLAMYDIA PROBE AMP ([place]) NOT AT [facility]    EKG None  Radiology No results found.  Procedures Procedures   Medications Ordered in ED Medications  metroNIDAZOLE (FLAGYL) tablet 2,000 mg (has no administration in time range)    ED Course  I have reviewed the triage vital signs and the nursing notes.  Pertinent labs & imaging results that were available during my care of the patient were reviewed by me and considered in my medical decision making (see  chart for details).    MDM Rules/Calculators/A&P                           40 year old-year-old female who presents to the ED today with multiple complaints.  Complaining of some atraumatic lower back pain without radiation down the lower extremity.  Has been seen in the past for back pain.  She  is noted to mild spinal tenderness palpation.  She does have left paralumbar musculature TTP. She is neurovascularly intact.  Does not appear to be consistent with flank pain despite chief complaint.  She is not presenting with symptoms today concerning for cauda equina, spinal epidural abscess, AAA.  She did provide a urine sample in the waiting room today which has returned positive for trichomonas.  She reports history of same with recent treatment -Per chart review she was actually treated last April for same.  She is sexually active with her husband who recently traveled to jail.  She denies any pelvic pain.  No abdominal tenderness palpation on exam.  We will plan to have patient self swab and will plan to treat for trichomonas at this time.  She is instructed to have her partner tested and treated as well and she will wait on remainder of her tests.  She would like HIV and syphilis testing at this time.  Also complains of right ear pain.  On exam her right TM is clear without any signs of erythema.  She is noted to have some swelling and tenderness palpation along the right ear canal.  We will treat for otitis externa.   Wet prep positive for trich. No other findings. Will treat with flagyl in the ED x 1. Pt to be discharged home at this time with prescription for otitis externa. She is encourage to take Ibuprofen/Tylenol PRN for her back pain and to follow up with PCP for same. She understands to not have intercourse until she has heard about the remainder of her STI results and to have her partner tested/treated as well.   This note was prepared using Dragon voice recognition software and may include  unintentional dictation errors due to the inherent limitations of voice recognition software.   Final Clinical Impression(s) / ED Diagnoses Final diagnoses:  Acute otitis externa of right ear, unspecified type  Trichomonas infection  Acute left-sided low back pain without sciatica    Rx / DC Orders ED Discharge Orders          Ordered    ofloxacin (FLOXIN) 0.3 % OTIC solution  Daily        11/27/21 1645             Discharge Instructions      Please pick up ear drop prescription and use as prescribed to cover for an outer ear infection. Follow up with your PCP for recheck of symptoms in 1-2 weeks.   You tested positive for trichomonas in the ED today. We have given you medication here to cover for this infection. You will need to let all partners know that they need to be tested and treated as well. It is recommended that you await to hear back from the remainder of your STI results prior to engaging in sexual intercourse. If you test positive for any other STIs you can follow up with your PCP or the local health department for treatment.   I would recommend taking Ibuprofen and Tylenol as needed for pain.   Follow up with your PCP for further evaluation regarding your ED visit today  Return to the ED for any new/worsening symptoms       Eustaquio Maize, PA-C 11/27/21 1647    Kommor, Debe Coder, MD 11/28/21 [phone]

## 2021-11-27 NOTE — ED Triage Notes (Signed)
Pt c/o left flank pain x 4 days-states pain started after "drinking excessive sodas"-right earache started last night-NAD-steady gait

## 2021-11-27 NOTE — Discharge Instructions (Addendum)
Please pick up ear drop prescription and use as prescribed to cover for an outer ear infection. Follow up with your PCP for recheck of symptoms in 1-2 weeks.   You tested positive for trichomonas in the ED today. We have given you medication here to cover for this infection. You will need to let all partners know that they need to be tested and treated as well. It is recommended that you await to hear back from the remainder of your STI results prior to engaging in sexual intercourse. If you test positive for any other STIs you can follow up with your PCP or the local health department for treatment.   I would recommend taking Ibuprofen and Tylenol as needed for pain.   Follow up with your PCP for further evaluation regarding your ED visit today  Return to the ED for any new/worsening symptoms

## 2021-11-28 LAB — GC/CHLAMYDIA PROBE AMP (~~LOC~~) NOT AT ARMC
Chlamydia: NEGATIVE
Comment: NEGATIVE
Comment: NORMAL
Neisseria Gonorrhea: NEGATIVE

## 2021-11-28 LAB — RPR: RPR Ser Ql: NONREACTIVE

## 2023-05-05 ENCOUNTER — Encounter (HOSPITAL_BASED_OUTPATIENT_CLINIC_OR_DEPARTMENT_OTHER): Payer: Self-pay

## 2023-05-05 ENCOUNTER — Emergency Department (HOSPITAL_BASED_OUTPATIENT_CLINIC_OR_DEPARTMENT_OTHER)
Admission: EM | Admit: 2023-05-05 | Discharge: 2023-05-05 | Disposition: A | Discharge status: Home / Self Care | Payer: BLUE CROSS/BLUE SHIELD | Attending: Emergency Medicine | Admitting: Emergency Medicine

## 2023-05-05 ENCOUNTER — Other Ambulatory Visit: Payer: Self-pay

## 2023-05-05 DIAGNOSIS — K029 Dental caries, unspecified: Secondary | ICD-10-CM

## 2023-05-05 MED ORDER — AMOXICILLIN-POT CLAVULANATE 875-125 MG PO TABS
1.0000 | ORAL_TABLET | Freq: Once | ORAL | Status: AC
  Administered 2023-05-05: 1 via ORAL
  Filled 2023-05-05: qty 1

## 2023-05-05 MED ORDER — AMOXICILLIN-POT CLAVULANATE 875-125 MG PO TABS: 1.0000 | ORAL_TABLET | Freq: Two times a day (BID) | ORAL | 0 refills | Status: DC

## 2023-05-05 NOTE — Discharge Instructions (Addendum)
Please make an appointment with the dentist I have attached your for you or one of your own choosing.  Today your exam did not show any signs of infection or inflammation but does show a dental cavity that will need to be further evaluated by a dentist.  You are given 1 dose of Augmentin in the ER but please pick up prescription and take as prescribed to prevent any infections.  If symptoms worsen please return to ER.

## 2023-05-05 NOTE — ED Provider Notes (Signed)
[facility] Provider Note   CSN: [id number] Arrival date & time: 05/05/23  2044     History  Chief Complaint  Patient presents with   Dental Pain    Michelle Baldwin is a 42 y.o. female with no significant medical history presenting with 1 month of dental pain.  Patient stated that by left side of her mouth she felt a tooth crack however yesterday began noticing tenderness in the area.  Patient states she is still able to eat and drink without issue and denies any fevers or chills or any facial or neck swelling.  Patient denies any skin color changes.  Patient states that she has not tried anything for pain and does not see a dentist.  Patient states she gets pain when she tries to eat.  Patient denied any neck pain, headaches, vision changes, ear pain, dizziness, mouth swelling    Home Medications Prior to Admission medications   Medication Sig Start Date End Date Taking? Authorizing Provider  amoxicillin-clavulanate (AUGMENTIN) 875-125 MG tablet Take 1 tablet by mouth every 12 (twelve) hours. 05/05/23  Yes Jazleen Robeck, Beverly Gust, PA-C  cyclobenzaprine (FLEXERIL) 10 MG tablet Take 1 tablet (10 mg total) by mouth 3 (three) times daily as needed for muscle spasms. 03/31/20   Terrilee Files, MD  methyldopa (ALDOMET) 500 MG tablet Take 500 mg by mouth 2 (two) times daily.    [provider]  promethazine-dextromethorphan (PROMETHAZINE-DM) 6.25-15 MG/5ML syrup Take 5 mLs by mouth 4 (four) times daily as needed for cough. 10/17/17   Rolland Porter, MD      Allergies    Patient has no known allergies.    Review of Systems   Review of Systems See HPI Physical Exam Updated Vital Signs BP (!) 149/106 (BP Location: Right Arm)   Pulse 87   Temp 98.6 F (37 C)   Resp 20   SpO2 95%  Physical Exam Constitutional:      General: She is not in acute distress.    Appearance: She is not ill-appearing.     Comments: On phone  HENT:     Head:      Comments: No facial edema noted or overlying skin color changes No step-off/crepitus/abnormalities were palpated No areas of fluctuance were noted Nontender to palpation    Mouth/Throat:      Comments: Cavity noted without erythema or edema Gums were normal No oral edema noted Oral floor not elevated Patient tolerating secretions Teeth not loose to palpation and nontender to palpation Neck:     Comments: No neck swelling noted Neurological:     Mental Status: She is alert.  Psychiatric:        Mood and Affect: Mood normal.     ED Results / Procedures / Treatments   Labs (all labs ordered are listed, but only abnormal results are displayed) Labs Reviewed - No data to display  EKG None  Radiology No results found.  Procedures Procedures    Medications Ordered in ED Medications  amoxicillin-clavulanate (AUGMENTIN) 875-125 MG per tablet 1 tablet (has no administration in time range)    ED Course/ Medical Decision Making/ A&P                             Medical Decision Making Risk Prescription drug management.   Michelle Baldwin 42 y.o. presented today for dental pain. Working DDx that I considered at this  time includes, but not limited to, reversible vs irreversible pulpitis, ludwig's angina, OM, dental abscess, PTA, RPA, Orofacial space infx, peripharyngeal space infx, dental caries, necrotizing gingivitis, gingivitis, peritonitis, parapharyngeal infx.  R/o DDx: ludwig's angina, OM, dental abscess, PTA, RPA, Orofacial space infx, peripharyngeal space infx, dental caries, necrotizing gingivitis, gingivitis, peritonitis, parapharyngeal infx: These are considered less likely due to history of present illness and physical exam findings  Review of prior external notes: 10/26/2019 ED  Unique Tests and My Interpretation: None  Discussion with Independent Historian: None  Discussion of Management of Tests: None  Risk: Medium: prescription drug management  Risk  Stratification Score: None  Plan: Patient presented for dental pain. On exam patient was in no acute distress.  Patient did not have facial swelling or neck swelling noted and did not appear clinically ill.  On exam dental caries was noted however no signs of infection were noted.  Patient tolerating secretions and patient states that she is been able to eat and drink without issue.  Patient does not tried any pain meds for her pain and does not see a dentist.  Patient denied any systemic symptoms concerning for infection.  Patient does not have penicillin allergy will be given 1 dose of Augmentin in the emergency department today with a prescription and encouraged to follow-up with a dentist.  I spoke to the patient about return precautions and the importance of following up with a dentist as the mouth is an area that has a lot of bacteria and can cause infection easily.  Patient verbalizes agreement to this plan.  Patient educated on importance of following up with PCP and dentist for definitive management.  Patient was given return precautions. Patient stable for discharge at this time.  Patient verbalized understanding of plan.         Final Clinical Impression(s) / ED Diagnoses Final diagnoses:  Dental caries    Rx / DC Orders ED Discharge Orders          Ordered    amoxicillin-clavulanate (AUGMENTIN) 875-125 MG tablet  Every 12 hours        05/05/23 2114              Remi Deter 05/05/23 2124    Alvira Monday, MD 05/06/23 1359

## 2023-05-05 NOTE — ED Triage Notes (Signed)
Pt is having dental pain on the L lower side for the past month and is now having swelling over the past 2 days

## 2024-01-14 ENCOUNTER — Encounter (HOSPITAL_BASED_OUTPATIENT_CLINIC_OR_DEPARTMENT_OTHER): Payer: Self-pay

## 2024-01-14 ENCOUNTER — Other Ambulatory Visit: Payer: Self-pay

## 2024-01-14 ENCOUNTER — Emergency Department (HOSPITAL_BASED_OUTPATIENT_CLINIC_OR_DEPARTMENT_OTHER): Payer: No Typology Code available for payment source

## 2024-01-14 ENCOUNTER — Emergency Department (HOSPITAL_BASED_OUTPATIENT_CLINIC_OR_DEPARTMENT_OTHER)
Admission: EM | Admit: 2024-01-14 | Discharge: 2024-01-14 | Disposition: A | Discharge status: Home / Self Care | Payer: No Typology Code available for payment source | Attending: Emergency Medicine | Admitting: Emergency Medicine

## 2024-01-14 DIAGNOSIS — M25562 Pain in left knee: Secondary | ICD-10-CM | POA: Diagnosis present

## 2024-01-14 DIAGNOSIS — Z79899 Other long term (current) drug therapy: Secondary | ICD-10-CM | POA: Diagnosis not present

## 2024-01-14 MED ORDER — MELOXICAM 7.5 MG PO TABS: 7.5000 mg | ORAL_TABLET | Freq: Every day | ORAL | 0 refills | Status: AC

## 2024-01-14 NOTE — ED Notes (Signed)
D/c paperwork reviewed with pt, including prescriptions and follow up care.  No questions or concerns voiced at time of d/c. . Pt verbalized understanding, Ambulatory without assistance to ED exit, NAD.   

## 2024-01-14 NOTE — Discharge Instructions (Signed)
Please read and follow all provided instructions.  Your diagnoses today include:  1. Acute pain of left knee     Tests performed today include: An x-ray of the affected area - does NOT show any broken bones Vital signs. See below for your results today.   Medications prescribed:  Meloxicam - anti-inflammatory pain medication  You have been prescribed an anti-inflammatory medication or NSAID. Take with food. Do not take aspirin, ibuprofen, or naproxen if taking this medication. Take smallest effective dose for the shortest duration needed for your pain. Stop taking if you experience stomach pain or vomiting.   Take any prescribed medications only as directed.  Home care instructions:  Follow any educational materials contained in this packet Follow R.I.C.E. Protocol: R - rest your injury  I  - use ice on injury without applying directly to skin C - compress injury with bandage or splint E - elevate the injury as much as possible  Follow-up instructions: Please follow-up with your primary care provider or the provided orthopedic physician (bone specialist) if you continue to have significant pain in 1 week. In this case you may have a more severe injury that requires further care.   Return instructions:  Please return if your toes or feet are numb or tingling, appear gray or blue, or you have severe pain (also elevate the leg and loosen splint or wrap if you were given one) Please return to the Emergency Department if you experience worsening symptoms.  Please return if you have any other emergent concerns.  Additional Information:  Your vital signs today were: BP (!) 151/100 (BP Location: Left Arm)   Pulse 79   Temp 97.9 F (36.6 C)   Resp 18   Ht 5\' 3"  (1.6 m)   Wt (!) 169.6 kg   LMP 01/03/2024 (Approximate)   SpO2 97%   BMI 66.25 kg/m  If your blood pressure (BP) was elevated above 135/85 this visit, please have this repeated by your doctor within one  month. --------------

## 2024-01-14 NOTE — ED Triage Notes (Signed)
Pt complains of left leg pain. States that pain starts and knee and goes down. Denies falling. Denies any new swelling

## 2024-01-14 NOTE — ED Provider Notes (Signed)
[facility] Provider Note   CSN: [id number] Arrival date & time: 01/14/24  1537     History  Chief Complaint  Patient presents with   Leg Pain    Michelle Baldwin is a 43 y.o. female.  Patient presents to the emergency department today for evaluation of left knee pain.  Symptoms started in the past few days.  She describes a throbbing pain in the area of the left knee that is worse with certain shins and with twisting.  No recent falls or injuries.  She has a history of sciatica, however this pain is not typical for her sciatica pain.  She has no thigh pain.  Also denies calf pain or swelling.  No history of blood clot.       Home Medications Prior to Admission medications   Medication Sig Start Date End Date Taking? Authorizing Provider  amoxicillin-clavulanate (AUGMENTIN) 875-125 MG tablet Take 1 tablet by mouth every 12 (twelve) hours. 05/05/23   Netta Corrigan, PA-C  cyclobenzaprine (FLEXERIL) 10 MG tablet Take 1 tablet (10 mg total) by mouth 3 (three) times daily as needed for muscle spasms. 03/31/20   Terrilee Files, MD  methyldopa (ALDOMET) 500 MG tablet Take 500 mg by mouth 2 (two) times daily.    [provider]  promethazine-dextromethorphan (PROMETHAZINE-DM) 6.25-15 MG/5ML syrup Take 5 mLs by mouth 4 (four) times daily as needed for cough. 10/17/17   Rolland Porter, MD      Allergies    Patient has no known allergies.    Review of Systems   Review of Systems  Physical Exam Updated Vital Signs BP (!) 151/100 (BP Location: Left Arm)   Pulse 79   Temp 97.9 F (36.6 C)   Resp 18   Ht 5\' 3"  (1.6 m)   Wt (!) 169.6 kg   LMP 01/03/2024 (Approximate)   SpO2 97%   BMI 66.25 kg/m  Physical Exam Vitals and nursing note reviewed.  Constitutional:      Appearance: She is well-developed.  HENT:     Head: Normocephalic and atraumatic.  Eyes:     Conjunctiva/sclera: Conjunctivae normal.     Pupils: Pupils are  equal, round, and reactive to light.  Cardiovascular:     Pulses: Normal pulses. No decreased pulses.  Pulmonary:     Effort: No respiratory distress.  Musculoskeletal:        General: Tenderness present.     Cervical back: Normal range of motion and neck supple.     Left hip: Normal range of motion.     Left upper leg: No tenderness or bony tenderness.     Left knee: No effusion. Normal range of motion. Tenderness present over the medial joint line and lateral joint line.     Left lower leg: No tenderness or bony tenderness.  Skin:    General: Skin is warm and dry.  Neurological:     Mental Status: She is alert.     Sensory: No sensory deficit.     Comments: Motor, sensation, and vascular distal to the injury is fully intact.   Psychiatric:        Mood and Affect: Mood normal.     ED Results / Procedures / Treatments   Labs (all labs ordered are listed, but only abnormal results are displayed) Labs Reviewed - No data to display  EKG None  Radiology DG Knee Complete 4 Views Left Result Date: 01/14/2024 CLINICAL DATA:  Knee pain.  No history of trauma EXAM: LEFT KNEE - COMPLETE 4+ VIEW COMPARISON:  None Available. FINDINGS: No evidence of fracture, dislocation, or joint effusion. No evidence of arthropathy or other focal bone abnormality. Soft tissues are unremarkable. IMPRESSION: No acute osseous abnormality. Electronically Signed   By: Karen Kays M.D.   On: 01/14/2024 17:41    Procedures Procedures    Medications Ordered in ED Medications - No data to display  ED Course/ Medical Decision Making/ A&P    Patient seen and examined. History obtained directly from patient.   Labs/EKG: None ordered  Imaging: Ordered x-ray of the left knee.  Medications/Fluids: None ordered  Most recent vital signs reviewed and are as follows: BP (!) 151/100 (BP Location: Left Arm)   Pulse 79   Temp 97.9 F (36.6 C)   Resp 18   Ht 5\' 3"  (1.6 m)   Wt (!) 169.6 kg   LMP  01/03/2024 (Approximate)   SpO2 97%   BMI 66.25 kg/m   Initial impression: Left knee pain.  Exam and history not concerning for DVT.  No signs of arterial insufficiency.  6:07 PM Reassessment performed. Patient appears stable.  Imaging personally visualized and interpreted including: X-ray, agree negative for fracture or dislocation.  Reviewed pertinent lab work and imaging with patient at bedside. Questions answered.   Most current vital signs reviewed and are as follows: BP (!) 151/100 (BP Location: Left Arm)   Pulse 79   Temp 97.9 F (36.6 C)   Resp 18   Ht 5\' 3"  (1.6 m)   Wt (!) 169.6 kg   LMP 01/03/2024 (Approximate)   SpO2 97%   BMI 66.25 kg/m   Plan: Discharge to home.   Prescriptions written for: Meloxicam trial  Other home care instructions discussed: RICE protocol  ED return instructions discussed: New or worsening symptoms  Follow-up instructions discussed: Patient encouraged to follow-up with their PCP or sports medicine referral in 7 days.                                   Medical Decision Making Amount and/or Complexity of Data Reviewed Radiology: ordered.   Patient with left knee pain, negative x-rays.  Lower extremity is neurovascularly intact.  Symptoms are not consistent with DVT or arterial insufficiency.  No signs of cellulitis.  Do not suspect septic arthritis or gout given lack of associated effusion and history.         Final Clinical Impression(s) / ED Diagnoses Final diagnoses:  Acute pain of left knee    Rx / DC Orders ED Discharge Orders          Ordered    meloxicam (MOBIC) 7.5 MG tablet  Daily        01/14/24 1805              Renne Crigler, PA-C 01/14/24 1809    Terrilee Files, MD 01/15/24 [phone]

## 2024-01-15 ENCOUNTER — Emergency Department (HOSPITAL_BASED_OUTPATIENT_CLINIC_OR_DEPARTMENT_OTHER)
Admission: EM | Admit: 2024-01-15 | Discharge: 2024-01-15 | Disposition: A | Discharge status: Home / Self Care | Payer: No Typology Code available for payment source | Attending: Emergency Medicine | Admitting: Emergency Medicine

## 2024-01-15 ENCOUNTER — Encounter (HOSPITAL_BASED_OUTPATIENT_CLINIC_OR_DEPARTMENT_OTHER): Payer: Self-pay

## 2024-01-15 DIAGNOSIS — M25562 Pain in left knee: Secondary | ICD-10-CM | POA: Diagnosis present

## 2024-01-15 DIAGNOSIS — A599 Trichomoniasis, unspecified: Secondary | ICD-10-CM | POA: Diagnosis not present

## 2024-01-15 DIAGNOSIS — N939 Abnormal uterine and vaginal bleeding, unspecified: Secondary | ICD-10-CM | POA: Insufficient documentation

## 2024-01-15 LAB — CBC
HCT: 36.6 % (ref 36.0–46.0)
Hemoglobin: 12.9 g/dL (ref 12.0–15.0)
MCH: 26.9 pg (ref 26.0–34.0)
MCHC: 35.2 g/dL (ref 30.0–36.0)
MCV: 76.3 fL — ABNORMAL LOW (ref 80.0–100.0)
Platelets: 337 10*3/uL (ref 150–400)
RBC: 4.8 MIL/uL (ref 3.87–5.11)
RDW: 14.5 % (ref 11.5–15.5)
WBC: 5.6 10*3/uL (ref 4.0–10.5)
nRBC: 0 % (ref 0.0–0.2)

## 2024-01-15 LAB — URINALYSIS, ROUTINE W REFLEX MICROSCOPIC
Bilirubin Urine: NEGATIVE
Glucose, UA: NEGATIVE mg/dL
Ketones, ur: NEGATIVE mg/dL
Leukocytes,Ua: NEGATIVE
Nitrite: NEGATIVE
Protein, ur: NEGATIVE mg/dL
Specific Gravity, Urine: >=1.03 (ref 1.005–1.030)
pH: 6 (ref 5.0–8.0)

## 2024-01-15 LAB — URINALYSIS, MICROSCOPIC (REFLEX)

## 2024-01-15 LAB — WET PREP, GENITAL
Clue Cells Wet Prep HPF POC: NONE SEEN
Sperm: NONE SEEN
WBC, Wet Prep HPF POC: <10 (ref <10)
Yeast Wet Prep HPF POC: NONE SEEN

## 2024-01-15 LAB — HCG, SERUM, QUALITATIVE: Preg, Serum: NEGATIVE

## 2024-01-15 MED ORDER — CEFTRIAXONE SODIUM 1 G IJ SOLR
1.0000 g | Freq: Once | INTRAMUSCULAR | Status: AC
  Administered 2024-01-15: 1 g via INTRAMUSCULAR
  Filled 2024-01-15: qty 10

## 2024-01-15 MED ORDER — ACETAMINOPHEN 500 MG PO TABS
1000.0000 mg | ORAL_TABLET | Freq: Once | ORAL | Status: AC
  Administered 2024-01-15: 1000 mg via ORAL
  Filled 2024-01-15: qty 2

## 2024-01-15 MED ORDER — METRONIDAZOLE 500 MG PO TABS
2000.0000 mg | ORAL_TABLET | Freq: Once | ORAL | Status: AC
  Administered 2024-01-15: 2000 mg via ORAL
  Filled 2024-01-15: qty 4

## 2024-01-15 MED ORDER — AZITHROMYCIN 250 MG PO TABS
1000.0000 mg | ORAL_TABLET | Freq: Once | ORAL | Status: AC
  Administered 2024-01-15: 1000 mg via ORAL
  Filled 2024-01-15: qty 4

## 2024-01-15 MED ORDER — LIDOCAINE HCL (PF) 1 % IJ SOLN
1.0000 mL | Freq: Once | INTRAMUSCULAR | Status: AC
  Administered 2024-01-15: 2.1 mL
  Filled 2024-01-15: qty 5

## 2024-01-15 NOTE — ED Triage Notes (Signed)
Pt c/o left knee pain and was here for the same last night.  Pt c/o vaginal bleeding that started last night. Pt reports she just had a period that ended on 1/6 and bleeding started again last night

## 2024-01-15 NOTE — ED Provider Notes (Signed)
[facility] Provider Note   CSN: [id number] Arrival date & time: 01/15/24  1242     History  Chief Complaint  Patient presents with   Knee Pain   Vaginal Bleeding    Michelle Baldwin is a 43 y.o. female who presents with atraumatic left knee pain for the past few weeks and vaginal bleeding/discharge since last night.  Was evaluated last night at this facility for left knee pain.  X-rays at that time demonstrated no osseous abnormality.  Denies any numbness or tingling in her lower extremity.  No pain at rest.  Reports that she just finished her period 2 days ago.  Denies any vomiting or diarrhea.  Reports that she is sexually active with a new partner.  Does endorse some brown vaginal discharge vs bleeding with a " fishy odor".  Knee Pain Vaginal Bleeding      Home Medications Prior to Admission medications   Medication Sig Start Date End Date Taking? Authorizing Provider  amoxicillin-clavulanate (AUGMENTIN) 875-125 MG tablet Take 1 tablet by mouth every 12 (twelve) hours. 05/05/23   Netta Corrigan, PA-C  cyclobenzaprine (FLEXERIL) 10 MG tablet Take 1 tablet (10 mg total) by mouth 3 (three) times daily as needed for muscle spasms. 03/31/20   Terrilee Files, MD  meloxicam (MOBIC) 7.5 MG tablet Take 1 tablet (7.5 mg total) by mouth daily. 01/14/24   Renne Crigler, PA-C  methyldopa (ALDOMET) 500 MG tablet Take 500 mg by mouth 2 (two) times daily.    [provider]  promethazine-dextromethorphan (PROMETHAZINE-DM) 6.25-15 MG/5ML syrup Take 5 mLs by mouth 4 (four) times daily as needed for cough. 10/17/17   Rolland Porter, MD      Allergies    Patient has no known allergies.    Review of Systems   Review of Systems  Genitourinary:  Positive for vaginal bleeding.    Physical Exam Updated Vital Signs BP (!) 154/107 (BP Location: Left Arm)   Pulse 73   Temp 98.2 F (36.8 C) (Oral)   Resp 18   Ht 5\' 3"  (1.6 m)   Wt (!) 169.6  kg   LMP 01/03/2024 (Approximate)   SpO2 98%   BMI 66.25 kg/m  Physical Exam Vitals and nursing note reviewed.  Constitutional:      General: She is not in acute distress.    Appearance: She is well-developed.  HENT:     Head: Normocephalic and atraumatic.  Eyes:     Conjunctiva/sclera: Conjunctivae normal.  Cardiovascular:     Rate and Rhythm: Normal rate and regular rhythm.     Heart sounds: No murmur heard. Pulmonary:     Effort: Pulmonary effort is normal. No respiratory distress.     Breath sounds: Normal breath sounds.  Abdominal:     Palpations: Abdomen is soft.     Tenderness: There is no abdominal tenderness.  Musculoskeletal:        General: No swelling.     Cervical back: Neck supple.     Comments: Tolerates full range of motion of left knee, noticed significant swelling, erythema or warmth appreciated.  Appears to be more tender has lateral joint line, stable to varus and valgus, NVI, DP pulses symmetric, negative Homans  Skin:    General: Skin is warm and dry.     Capillary Refill: Capillary refill takes less than 2 seconds.  Neurological:     Mental Status: She is alert.  Psychiatric:  Mood and Affect: Mood normal.     ED Results / Procedures / Treatments   Labs (all labs ordered are listed, but only abnormal results are displayed) Labs Reviewed  WET PREP, GENITAL - Abnormal; Notable for the following components:      Result Value   Trich, Wet Prep PRESENT (*)    All other components within normal limits  CBC - Abnormal; Notable for the following components:   MCV 76.3 (*)    All other components within normal limits  PREGNANCY, URINE  RPR  URINALYSIS, ROUTINE W REFLEX MICROSCOPIC  HCG, SERUM, QUALITATIVE  HIV ANTIBODY (ROUTINE TESTING W REFLEX)  GC/CHLAMYDIA PROBE AMP ([place]) NOT AT [facility]    EKG None  Radiology DG Knee Complete 4 Views Left Result Date: 01/14/2024 CLINICAL DATA:  Knee pain.  No history of trauma EXAM: LEFT KNEE -  COMPLETE 4+ VIEW COMPARISON:  None Available. FINDINGS: No evidence of fracture, dislocation, or joint effusion. No evidence of arthropathy or other focal bone abnormality. Soft tissues are unremarkable. IMPRESSION: No acute osseous abnormality. Electronically Signed   By: Karen Kays M.D.   On: 01/14/2024 17:41    Procedures Procedures    Medications Ordered in ED Medications  azithromycin (ZITHROMAX) tablet 1,000 mg (1,000 mg Oral Given 01/15/24 1628)  cefTRIAXone (ROCEPHIN) injection 1 g (1 g Intramuscular Given 01/15/24 1629)  lidocaine (PF) (XYLOCAINE) 1 % injection 1-2.1 mL (2.1 mLs Other Given 01/15/24 1629)  metroNIDAZOLE (FLAGYL) tablet 2,000 mg (2,000 mg Oral Given 01/15/24 1628)  acetaminophen (TYLENOL) tablet 1,000 mg (1,000 mg Oral Given 01/15/24 1705)    ED Course/ Medical Decision Making/ A&P                                 Medical Decision Making Amount and/or Complexity of Data Reviewed Labs: ordered.   This patient presents to the ED with chief complaint(s) of left knee pain and vaginal bleeding/discharge.  The complaint involves an extensive differential diagnosis and also carries with it a high risk of complications and morbidity.   pertinent past medical history as listed in HPI  The differential diagnosis includes  Septic joint, gout, sprain, arthritis, adenomyosis, endometriosis, dysfunctional uterine bleeding, fibroids The initial plan is to  Will obtain basic labs, pelvic exam Additional history obtained:  Records reviewed previous admission documents  Initial Assessment:   Hemodynamically stable, afebrile, nontoxic-appearing patient presenting with atraumatic left knee pain and vaginal bleeding.  X-rays from yesterday are unremarkable.  Low suspicion for gout, DVT, septic joint as there is no erythema, warmth tolerates full range of motion of knee.  Overall suspect musculoskeletal etiology such as patellar tendinitis.  She does have some vaginal bleeding 2  days after.  With discharge.   Independent ECG interpretation:  none  Independent labs interpretation:  The following labs were independently interpreted:  Wet prep positive for trichomoniasis, CBC without significant abnormality, UA with many bacteria and trichomonas present, moderate hemoglobin  Independent visualization and interpretation of imaging: none  Treatment and Reassessment: Patient given STI empiric antibiotics  Consultations obtained:   none  Disposition:   Patient will be discharged home.  Fitted for left knee brace.  Remainder of STI panel still pending. The patient has been appropriately medically screened and/or stabilized in the ED. I have low suspicion for any other emergent medical condition which would require further screening, evaluation or treatment in the ED or require inpatient management.  At time of discharge the patient is hemodynamically stable and in no acute distress. I have discussed work-up results and diagnosis with patient and answered all questions. Patient is agreeable with discharge plan. We discussed strict return precautions for returning to the emergency department and they verbalized understanding.     Social Determinants of Health:   Patient's impaired access to primary care  increases the complexity of managing their presentation  This note was dictated with voice recognition software.  Despite best efforts at proofreading, errors may have occurred which can change the documentation meaning.          Final Clinical Impression(s) / ED Diagnoses Final diagnoses:  Trichomonas infection  Acute pain of left knee    Rx / DC Orders ED Discharge Orders     None         Halford Decamp, PA-C 01/15/24 1748    Royanne Foots, DO 01/20/24 [phone]

## 2024-01-15 NOTE — Discharge Instructions (Addendum)
You were evaluated in the emergency room for left knee pain and vaginal bleeding/ discharge.  I was able to view your x-rays from yesterday which did not show any significant abnormality. You were fitted with a  knee brace today.  Please wear this while ambulating.  As discussed I would recommend picking up the meloxicam as prescribed yesterday.  You have also been provided a referral for orthopedic doctor.  You tested positive for trichomonas, and STI.  The remainder of your STI panel still pending, however you were given empiric antibiotics for most common STIs.  If you continue to have discharge and or bleeding please follow-up with your OB/GYN.  If you experience any new or worsening symptoms including extreme abdominal pain, fevers or chills please return to the emergency room.

## 2024-01-16 LAB — RPR: RPR Ser Ql: NONREACTIVE

## 2024-01-16 LAB — HIV ANTIBODY (ROUTINE TESTING W REFLEX): HIV Screen 4th Generation wRfx: NONREACTIVE

## 2024-01-18 LAB — GC/CHLAMYDIA PROBE AMP (~~LOC~~) NOT AT ARMC
Chlamydia: NEGATIVE
Comment: NEGATIVE
Comment: NORMAL
Neisseria Gonorrhea: NEGATIVE

## 2024-02-02 ENCOUNTER — Other Ambulatory Visit: Payer: Self-pay

## 2024-02-02 ENCOUNTER — Encounter (HOSPITAL_BASED_OUTPATIENT_CLINIC_OR_DEPARTMENT_OTHER): Payer: Self-pay | Admitting: Emergency Medicine

## 2024-02-02 ENCOUNTER — Emergency Department (HOSPITAL_BASED_OUTPATIENT_CLINIC_OR_DEPARTMENT_OTHER)
Admission: EM | Admit: 2024-02-02 | Discharge: 2024-02-02 | Disposition: A | Discharge status: Home / Self Care | Payer: No Typology Code available for payment source | Attending: Emergency Medicine | Admitting: Emergency Medicine

## 2024-02-02 DIAGNOSIS — F17210 Nicotine dependence, cigarettes, uncomplicated: Secondary | ICD-10-CM | POA: Diagnosis not present

## 2024-02-02 DIAGNOSIS — Z20822 Contact with and (suspected) exposure to covid-19: Secondary | ICD-10-CM | POA: Diagnosis not present

## 2024-02-02 DIAGNOSIS — J101 Influenza due to other identified influenza virus with other respiratory manifestations: Secondary | ICD-10-CM | POA: Insufficient documentation

## 2024-02-02 DIAGNOSIS — R059 Cough, unspecified: Secondary | ICD-10-CM | POA: Diagnosis present

## 2024-02-02 LAB — RESP PANEL BY RT-PCR (RSV, FLU A&B, COVID)  RVPGX2
Influenza A by PCR: POSITIVE — AB
Influenza B by PCR: NEGATIVE
Resp Syncytial Virus by PCR: NEGATIVE
SARS Coronavirus 2 by RT PCR: NEGATIVE

## 2024-02-02 MED ORDER — GUAIFENESIN-DM 100-10 MG/5ML PO SYRP: 5.0000 mL | ORAL_SOLUTION | ORAL | 0 refills | Status: AC | PRN

## 2024-02-02 MED ORDER — ACETAMINOPHEN 325 MG PO TABS: 650.0000 mg | ORAL_TABLET | Freq: Four times a day (QID) | ORAL | 0 refills | Status: AC | PRN

## 2024-02-02 MED ORDER — OXYMETAZOLINE HCL 0.05 % NA SOLN: 1.0000 | Freq: Two times a day (BID) | NASAL | 0 refills | Status: AC | PRN

## 2024-02-02 MED ORDER — IBUPROFEN 600 MG PO TABS: 600.0000 mg | ORAL_TABLET | Freq: Four times a day (QID) | ORAL | 0 refills | Status: AC | PRN

## 2024-02-02 MED ORDER — OXYCODONE-ACETAMINOPHEN 5-325 MG PO TABS
1.0000 | ORAL_TABLET | ORAL | Status: DC | PRN
  Administered 2024-02-02: 1 via ORAL
  Filled 2024-02-02: qty 1

## 2024-02-02 NOTE — Discharge Instructions (Signed)
You have been seen in the Emergency Department (ED) today for a likely viral illness.  Please drink plenty of clear fluids (water, Gatorade, chicken broth, etc).  You may use Tylenol and/or Motrin according to label instructions.  You can alternate between the two without any side effects.  Consider using humidifier in your bedroom.  Please follow up with your doctor as listed above.  Call your doctor or return to the Emergency Department (ED) if you are unable to tolerate fluids due to vomiting, have worsening trouble breathing, become extremely tired or difficult to awaken, or if you develop any other symptoms that concern you.

## 2024-02-02 NOTE — ED Triage Notes (Signed)
Per pt headache, URI Sx since yesterday .

## 2024-02-02 NOTE — ED Provider Notes (Signed)
 [place] [facility] AT [facility] Provider Note  CSN: [id number] Arrival date & time: 02/02/24 2004  Chief Complaint(s) Headache and URI  HPI Michelle Baldwin is a 43 y.o. female with past medical history as below, significant for tubal ligation, obesity who presents to the ED with complaint of URI symptoms, cough, headache, nausea, body aches  Feeling well x 2 days.  Children home sick with similar complaints.  She has no difficulty breathing, history of respiratory disease.  Headache relieved with ibuprofen .  She is a smoker, no rash.  No travel.   Past Medical History History reviewed. No pertinent past medical history. There are no active problems to display for this patient.  Home Medication(s) Prior to Admission medications   Medication Sig Start Date End Date Taking? Authorizing Provider  amoxicillin -clavulanate (AUGMENTIN ) 875-125 MG tablet Take 1 tablet by mouth every 12 (twelve) hours. 05/05/23   Victor Lynwood DASEN, PA-C  cyclobenzaprine  (FLEXERIL ) 10 MG tablet Take 1 tablet (10 mg total) by mouth 3 (three) times daily as needed for muscle spasms. 03/31/20   Towana Ozell BROCKS, MD  meloxicam  (MOBIC ) 7.5 MG tablet Take 1 tablet (7.5 mg total) by mouth daily. 01/14/24   Desiderio Chew, PA-C  methyldopa (ALDOMET) 500 MG tablet Take 500 mg by mouth 2 (two) times daily.    [provider]  promethazine -dextromethorphan (PROMETHAZINE -DM) 6.25-15 MG/5ML syrup Take 5 mLs by mouth 4 (four) times daily as needed for cough. 10/17/17   Lynwood Anes, MD                                                                                                                                    Past Surgical History Past Surgical History:  Procedure Laterality Date   TUBAL LIGATION     Family History History reviewed. No pertinent family history.  Social History Social History   Tobacco Use   Smoking status: Every Day    Types: Cigars   Smokeless tobacco: Never  Vaping  Use   Vaping status: Never Used  Substance Use Topics   Alcohol use: Not Currently   Drug use: Yes    Types: Marijuana   Allergies Patient has no known allergies.  Review of Systems Review of Systems  HENT:  Positive for congestion and rhinorrhea.   Respiratory:  Positive for cough.   Gastrointestinal:  Positive for nausea.  Genitourinary:  Negative for dysuria and urgency.  Musculoskeletal:  Positive for arthralgias.  Neurological:  Positive for headaches. Negative for numbness.  All other systems reviewed and are negative.   Physical Exam Vital Signs  I have reviewed the triage vital signs BP (!) 171/119 (BP Location: Right Arm)   Pulse 91   Temp 99.5 F (37.5 C)   Resp 18   Ht 5' 3 (1.6 m)   Wt (!) 166.9 kg   LMP 01/03/2024 (Approximate)   SpO2 96%   BMI 65.19  kg/m  Physical Exam Vitals and nursing note reviewed.  Constitutional:      General: She is not in acute distress.    Appearance: Normal appearance. She is obese.  HENT:     Head: Normocephalic and atraumatic.     Right Ear: External ear normal.     Left Ear: External ear normal.     Nose: Nose normal.     Mouth/Throat:     Mouth: Mucous membranes are moist.  Eyes:     General: No scleral icterus.       Right eye: No discharge.        Left eye: No discharge.  Cardiovascular:     Rate and Rhythm: Normal rate and regular rhythm.     Pulses: Normal pulses.     Heart sounds: Normal heart sounds.  Pulmonary:     Effort: Pulmonary effort is normal. No respiratory distress.     Breath sounds: Normal breath sounds. No stridor.  Abdominal:     General: Abdomen is flat. There is no distension.     Palpations: Abdomen is soft.     Tenderness: There is no abdominal tenderness.  Musculoskeletal:     Cervical back: Normal range of motion. No rigidity.     Right lower leg: No edema.     Left lower leg: No edema.  Skin:    General: Skin is warm and dry.     Capillary Refill: Capillary refill takes less  than 2 seconds.  Neurological:     General: No focal deficit present.     Mental Status: She is alert and oriented to person, place, and time.     GCS: GCS eye subscore is 4. GCS verbal subscore is 5. GCS motor subscore is 6.     Cranial Nerves: No dysarthria or facial asymmetry.     Motor: No tremor.     Gait: Gait is intact.  Psychiatric:        Mood and Affect: Mood normal.        Behavior: Behavior normal. Behavior is cooperative.     ED Results and Treatments Labs (all labs ordered are listed, but only abnormal results are displayed) Labs Reviewed  RESP PANEL BY RT-PCR (RSV, FLU A&B, COVID)  RVPGX2 - Abnormal; Notable for the following components:      Result Value   Influenza A by PCR POSITIVE (*)    All other components within normal limits                                                                                                                          Radiology No results found.  Pertinent labs & imaging results that were available during my care of the patient were reviewed by me and considered in my medical decision making (see MDM for details).  Medications Ordered in ED Medications  oxyCODONE -acetaminophen  (PERCOCET/ROXICET) 5-325 MG per tablet 1 tablet (1 tablet Oral Given 02/02/24 2040)  Procedures Procedures  (including critical care time)  Medical Decision Making / ED Course    Medical Decision Making:    Michelle Baldwin is a 43 y.o. female with past medical history as below, significant for tubal ligation, obesity who presents to the ED with complaint of URI symptoms, cough, headache, nausea, body aches. The complaint involves an extensive differential diagnosis and also carries with it a high risk of complications and morbidity.  Serious etiology was considered. Ddx includes but is not limited to: Flu, URI, pneumonia,  RSV, etc.  Complete initial physical exam performed, notably the patient was in no distress, no hypoxia.    Reviewed and confirmed nursing documentation for past medical history, family history, social history.  Vital signs reviewed.         Brief summary: 43 year old female history as above here with URI symptoms.  Her exam is reassuring, lungs clear bilateral.  No wheezing.  No hypoxia.  She had a headache earlier which resolved with Motrin .  Neuroexam is nonfocal.  Found to be positive for flu A.  This is likely etiology of her symptoms.  Children also sick with similar symptoms.  My suspicion for pneumonia or other acute life-threatening condition is reduced.   Discussed supportive care at home, strict return precautions.  The patient improved significantly and was discharged in stable condition. Detailed discussions were had with the patient/guardian regarding current findings, and need for close f/u with PCP or on call doctor. The patient/guardian has been instructed to return immediately if the symptoms worsen in any way for re-evaluation. Patient/guardian verbalized understanding and is in agreement with current care plan. All questions answered prior to discharge.                  Additional history obtained: -Additional history obtained from na -External records from outside source obtained and reviewed including: Chart review including previous notes, labs, imaging, consultation notes including  Prior ED visits, prior labs and imaging   Lab Tests: -I ordered, reviewed, and interpreted labs.   The pertinent results include:   Labs Reviewed  RESP PANEL BY RT-PCR (RSV, FLU A&B, COVID)  RVPGX2 - Abnormal; Notable for the following components:      Result Value   Influenza A by PCR POSITIVE (*)    All other components within normal limits    Notable for flu  EKG   EKG Interpretation Date/Time:    Ventricular Rate:    PR Interval:    QRS Duration:    QT  Interval:    QTC Calculation:   R Axis:      Text Interpretation:           Imaging Studies ordered: na   Medicines ordered and prescription drug management: Meds ordered this encounter  Medications   oxyCODONE -acetaminophen  (PERCOCET/ROXICET) 5-325 MG per tablet 1 tablet    Refill:  0    -I have reviewed the patients home medicines and have made adjustments as needed   Consultations Obtained: na   Cardiac Monitoring: Continuous pulse oximetry interpreted by myself, 98% on RA.    Social Determinants of Health:  Diagnosis or treatment significantly limited by social determinants of health: current smoker and obesity Counseled patient for approximately 3 minutes regarding smoking cessation. Discussed risks of smoking and how they applied and affected their visit here today. Patient not ready to quit at this time, however will follow up with their primary doctor when they are.   CPT code: 00593: intermediate counseling  for smoking cessation     Reevaluation: After the interventions noted above, I reevaluated the patient and found that they have improved  Co morbidities that complicate the patient evaluation History reviewed. No pertinent past medical history.    Dispostion: Disposition decision including need for hospitalization was considered, and patient discharged from emergency department.    Final Clinical Impression(s) / ED Diagnoses Final diagnoses:  None        Elnor Jayson LABOR, DO 02/02/24 2335

## 2024-02-02 NOTE — ED Triage Notes (Signed)
Per EMS headache X 2 days, and uri SX and NV, has tried OTC meds at home with no relief.

## 2024-12-26 ENCOUNTER — Other Ambulatory Visit: Payer: Self-pay

## 2024-12-26 ENCOUNTER — Encounter (HOSPITAL_BASED_OUTPATIENT_CLINIC_OR_DEPARTMENT_OTHER): Payer: Self-pay

## 2024-12-26 ENCOUNTER — Emergency Department (HOSPITAL_BASED_OUTPATIENT_CLINIC_OR_DEPARTMENT_OTHER)
Admission: EM | Admit: 2024-12-26 | Discharge: 2024-12-26 | Disposition: A | Discharge status: Home / Self Care | Attending: Emergency Medicine | Admitting: Emergency Medicine

## 2024-12-26 ENCOUNTER — Emergency Department (HOSPITAL_BASED_OUTPATIENT_CLINIC_OR_DEPARTMENT_OTHER)

## 2024-12-26 DIAGNOSIS — I160 Hypertensive urgency: Secondary | ICD-10-CM | POA: Diagnosis not present

## 2024-12-26 DIAGNOSIS — R519 Headache, unspecified: Secondary | ICD-10-CM | POA: Diagnosis present

## 2024-12-26 DIAGNOSIS — K029 Dental caries, unspecified: Secondary | ICD-10-CM | POA: Diagnosis not present

## 2024-12-26 DIAGNOSIS — Z79899 Other long term (current) drug therapy: Secondary | ICD-10-CM | POA: Diagnosis not present

## 2024-12-26 DIAGNOSIS — K047 Periapical abscess without sinus: Secondary | ICD-10-CM | POA: Diagnosis not present

## 2024-12-26 DIAGNOSIS — I10 Essential (primary) hypertension: Secondary | ICD-10-CM | POA: Insufficient documentation

## 2024-12-26 LAB — BASIC METABOLIC PANEL WITH GFR
Anion gap: 13 (ref 5–15)
BUN: 9 mg/dL (ref 6–20)
CO2: 21 mmol/L — ABNORMAL LOW (ref 22–32)
Calcium: 9.8 mg/dL (ref 8.9–10.3)
Chloride: 105 mmol/L (ref 98–111)
Creatinine, Ser: 0.67 mg/dL (ref 0.44–1.00)
GFR, Estimated: >60 mL/min
Glucose, Bld: 101 mg/dL — ABNORMAL HIGH (ref 70–99)
Potassium: 3.9 mmol/L (ref 3.5–5.1)
Sodium: 139 mmol/L (ref 135–145)

## 2024-12-26 LAB — CBC
HCT: 40.8 % (ref 36.0–46.0)
Hemoglobin: 14.6 g/dL (ref 12.0–15.0)
MCH: 26.7 pg (ref 26.0–34.0)
MCHC: 35.8 g/dL (ref 30.0–36.0)
MCV: 74.7 fL — ABNORMAL LOW (ref 80.0–100.0)
Platelets: 404 K/uL — ABNORMAL HIGH (ref 150–400)
RBC: 5.46 MIL/uL — ABNORMAL HIGH (ref 3.87–5.11)
RDW: 14.1 % (ref 11.5–15.5)
WBC: 8.7 K/uL (ref 4.0–10.5)
nRBC: 0 % (ref 0.0–0.2)

## 2024-12-26 LAB — HCG, QUANTITATIVE, PREGNANCY: hCG, Beta Chain, Quant, S: <1 m[IU]/mL

## 2024-12-26 MED ORDER — AMLODIPINE BESYLATE 5 MG PO TABS
5.0000 mg | ORAL_TABLET | Freq: Once | ORAL | Status: AC
  Administered 2024-12-26: 5 mg via ORAL
  Filled 2024-12-26: qty 1

## 2024-12-26 MED ORDER — NAPROXEN 500 MG PO TABS: 500.0000 mg | ORAL_TABLET | Freq: Two times a day (BID) | ORAL | 0 refills | Status: AC

## 2024-12-26 MED ORDER — METOCLOPRAMIDE HCL 5 MG/ML IJ SOLN
10.0000 mg | Freq: Once | INTRAMUSCULAR | Status: AC
  Administered 2024-12-26: 10 mg via INTRAVENOUS
  Filled 2024-12-26: qty 2

## 2024-12-26 MED ORDER — KETOROLAC TROMETHAMINE 15 MG/ML IJ SOLN
15.0000 mg | Freq: Once | INTRAMUSCULAR | Status: AC
  Administered 2024-12-26: 15 mg via INTRAVENOUS
  Filled 2024-12-26: qty 1

## 2024-12-26 MED ORDER — LACTATED RINGERS IV BOLUS
500.0000 mL | Freq: Once | INTRAVENOUS | Status: AC
  Administered 2024-12-26: 500 mL via INTRAVENOUS

## 2024-12-26 MED ORDER — AMOXICILLIN-POT CLAVULANATE 875-125 MG PO TABS: 1.0000 | ORAL_TABLET | Freq: Two times a day (BID) | ORAL | 0 refills | Status: AC

## 2024-12-26 MED ORDER — HYDRALAZINE HCL 20 MG/ML IJ SOLN
2.0000 mg | Freq: Once | INTRAMUSCULAR | Status: AC
  Administered 2024-12-26: 2 mg via INTRAVENOUS
  Filled 2024-12-26: qty 1

## 2024-12-26 MED ORDER — AMLODIPINE BESYLATE 5 MG PO TABS: 5.0000 mg | ORAL_TABLET | Freq: Every day | ORAL | 0 refills | Status: AC

## 2024-12-26 MED ORDER — AMOXICILLIN-POT CLAVULANATE 875-125 MG PO TABS
1.0000 | ORAL_TABLET | Freq: Once | ORAL | Status: AC
  Administered 2024-12-26: 1 via ORAL
  Filled 2024-12-26: qty 1

## 2024-12-26 NOTE — ED Triage Notes (Addendum)
 L bottom dental pain for 2 days. Denies fevers. Denies difficulty breathing or swallowing  Denies hx of HTN, reports headache, dizziness

## 2024-12-26 NOTE — ED Provider Notes (Signed)
 " [place] [facility] AT [facility] Provider Note   CSN: [id number] Arrival date & time: 12/26/24  1646     Patient presents with: Dental Pain and Headache   Michelle Baldwin is a 43 y.o. female.   Dental Pain Associated symptoms: headaches   Headache Patient is a 43 year old female was in the ED today for concerns for left lower jaw dental pain that been present x 2 days, noting to had accompanying headache today, described as pulsatile accompanied with light and sound sensitivity.  Has been taking BC powders without relief, has not seen dentistry.  Endorsing left side of nausea and vomiting today.  Previous medical history of obesity, tubal ligation  Denies blurry vision, vertigo, tinnitus, odynophagia, dysphagia, chest pain, shortness of breath, abdominal pain, diarrhea.     Prior to Admission medications  Medication Sig Start Date End Date Taking? Authorizing Provider  amLODipine  (NORVASC ) 5 MG tablet Take 1 tablet (5 mg total) by mouth daily. 12/26/24  Yes Ellyssa Zagal S, PA-C  amoxicillin -clavulanate (AUGMENTIN ) 875-125 MG tablet Take 1 tablet by mouth every 12 (twelve) hours for 10 days. 12/26/24 01/05/25 Yes Emmanual Gauthreaux S, PA-C  naproxen  (NAPROSYN ) 500 MG tablet Take 1 tablet (500 mg total) by mouth 2 (two) times daily. 12/26/24  Yes Blondell Laperle S, PA-C  acetaminophen  (TYLENOL ) 325 MG tablet Take 2 tablets (650 mg total) by mouth every 6 (six) hours as needed. 02/02/24   Elnor Jayson LABOR, DO  cyclobenzaprine  (FLEXERIL ) 10 MG tablet Take 1 tablet (10 mg total) by mouth 3 (three) times daily as needed for muscle spasms. 03/31/20   Towana Ozell BROCKS, MD  guaiFENesin -dextromethorphan (ROBITUSSIN DM) 100-10 MG/5ML syrup Take 5 mLs by mouth every 4 (four) hours as needed for cough. 02/02/24   Elnor Jayson LABOR, DO  ibuprofen  (ADVIL ) 600 MG tablet Take 1 tablet (600 mg total) by mouth every 6 (six) hours as needed. 02/02/24   Elnor Jayson LABOR, DO  meloxicam  (MOBIC ) 7.5  MG tablet Take 1 tablet (7.5 mg total) by mouth daily. 01/14/24   Desiderio Chew, PA-C  methyldopa (ALDOMET) 500 MG tablet Take 500 mg by mouth 2 (two) times daily.    [provider]  promethazine -dextromethorphan (PROMETHAZINE -DM) 6.25-15 MG/5ML syrup Take 5 mLs by mouth 4 (four) times daily as needed for cough. 10/17/17   Lynwood Anes, MD    Allergies: Patient has no known allergies.    Review of Systems  HENT:  Positive for dental problem.   Neurological:  Positive for headaches.  All other systems reviewed and are negative.   Updated Vital Signs BP (!) 198/110   Pulse 84   Temp 98.5 F (36.9 C) (Oral)   Resp 18   SpO2 99%   Physical Exam Vitals and nursing note reviewed.  Constitutional:      General: She is not in acute distress.    Appearance: Normal appearance. She is not ill-appearing or diaphoretic.  HENT:     Head: Normocephalic and atraumatic.     Mouth/Throat:     Mouth: Mucous membranes are moist. Oral lesions present. No lacerations.     Dentition: Abnormal dentition. Dental tenderness and dental caries present. No gingival swelling, dental abscesses or gum lesions.     Tongue: No lesions. Tongue does not deviate from midline.     Palate: No mass.     Pharynx: Oropharynx is clear. Uvula midline. No pharyngeal swelling, oropharyngeal exudate, posterior oropharyngeal erythema or uvula swelling.  Tonsils: No tonsillar exudate or tonsillar abscesses.  Eyes:     General: No scleral icterus.       Right eye: No discharge.        Left eye: No discharge.     Extraocular Movements: Extraocular movements intact.     Conjunctiva/sclera: Conjunctivae normal.  Cardiovascular:     Rate and Rhythm: Normal rate and regular rhythm.     Pulses: Normal pulses.     Heart sounds: Normal heart sounds. No murmur heard.    No friction rub. No gallop.  Pulmonary:     Effort: Pulmonary effort is normal. No respiratory distress.     Breath sounds: No stridor. No  wheezing, rhonchi or rales.  Chest:     Chest wall: No tenderness.  Abdominal:     General: Abdomen is flat. There is no distension.     Palpations: Abdomen is soft.     Tenderness: There is no abdominal tenderness. There is no right CVA tenderness, left CVA tenderness, guarding or rebound.  Musculoskeletal:        General: No swelling, deformity or signs of injury.     Cervical back: Normal range of motion. No rigidity.     Right lower leg: No edema.     Left lower leg: No edema.  Skin:    General: Skin is warm and dry.     Findings: No bruising, erythema or lesion.  Neurological:     General: No focal deficit present.     Mental Status: She is alert and oriented to person, place, and time. Mental status is at baseline.     Sensory: No sensory deficit.     Motor: No weakness.  Psychiatric:        Mood and Affect: Mood normal.     (all labs ordered are listed, but only abnormal results are displayed) Labs Reviewed  CBC - Abnormal; Notable for the following components:      Result Value   RBC 5.46 (*)    MCV 74.7 (*)    Platelets 404 (*)    All other components within normal limits  BASIC METABOLIC PANEL WITH GFR - Abnormal; Notable for the following components:   CO2 21 (*)    Glucose, Bld 101 (*)    All other components within normal limits  HCG, QUANTITATIVE, PREGNANCY    EKG: None  Radiology: CT Head Wo Contrast Result Date: 12/26/2024 EXAM: CT HEAD WITHOUT CONTRAST 12/26/2024 08:12:00 PM TECHNIQUE: CT of the head was performed without the administration of intravenous contrast. Automated exposure control, iterative reconstruction, and/or weight based adjustment of the mA/kV was utilized to reduce the radiation dose to as low as reasonably achievable. COMPARISON: None available. CLINICAL HISTORY: Headache, increasing frequency or severity FINDINGS: BRAIN AND VENTRICLES: No acute hemorrhage. No evidence of acute infarct. No hydrocephalus. No extra-axial collection. No  mass effect or midline shift. ORBITS: No acute abnormality. SINUSES: No acute abnormality. SOFT TISSUES AND SKULL: No acute soft tissue abnormality. No skull fracture. IMPRESSION: 1. No acute intracranial abnormality. Electronically signed by: Franky Stanford MD 12/26/2024 08:57 PM EST RP Workstation: [id number]    Procedures   Medications Ordered in the ED  lactated ringers bolus 500 mL (0 mLs Intravenous Stopped 12/26/24 2051)  metoCLOPramide  (REGLAN ) injection 10 mg (10 mg Intravenous Given 12/26/24 2001)  hydrALAZINE  (APRESOLINE ) injection 2 mg (2 mg Intravenous Given 12/26/24 2003)  hydrALAZINE  (APRESOLINE ) injection 2 mg (2 mg Intravenous Given 12/26/24 2111)  amoxicillin -clavulanate (AUGMENTIN ) 875-125 MG  per tablet 1 tablet (1 tablet Oral Given 12/26/24 2110)  ketorolac  (TORADOL ) 15 MG/ML injection 15 mg (15 mg Intravenous Given 12/26/24 2111)  amLODipine  (NORVASC ) tablet 5 mg (5 mg Oral Given 12/26/24 2122)     Medical Decision Making Amount and/or Complexity of Data Reviewed Labs: ordered.   This patient is a 43 year old female who presents to the ED for concern of left-sided lower dental pain accompanied with headache and hypertension, noted possibly not taking any medications currently having trouble to follow-up with PCP and dentistry due to insurance issues, recently acquiring new insurance.  States that she has had headache with light and sound sensitivity, pulsatile similar to previous headache she has had in the past.  Noticeably have pain from left jaw radiating up.  On physical exam, patient is in no acute distress, afebrile, alert and orient x 4, speaking in full sentences, nontachypneic, nontachycardic.  Notably does have poor dentition, with dental caries present to left lower jaw, with no gingival swelling noted or abscess.  Does note to have some pus coming from tooth.  Has full ROM at TMJ, tonsils are not erythematous with no exudate bilaterally, uvula is midline, no  PTA noted.  No sublingual swelling or tenderness, no submental tenderness or swelling.  Unremarkable otherwise.  Low suspicion for Ludwig's angina, PTA, retropharyngeal abscess.  No visible dental abscess noted today.  Suspecting headache likely secondary to dental infection versus high blood pressure.  Patient was provided blood pressure medications and pain medications and fluid.  On reevaluation, noted that she has had significant proved her symptoms currently no longer having headache or jaw pain.  Will send her home with antibiotics, having her follow-up with dentistry as well as with a PCP, sending her home with a short course of amlodipine  in the interim.  Patient vital signs have remained stable throughout the course of patient's time in the ED. Low suspicion for any other emergent pathology at this time. I believe this patient is safe to be discharged. Provided strict return to ER precautions. Patient expressed agreement and understanding of plan. All questions were answered.  Differential diagnoses prior to evaluation: The emergent differential diagnosis includes, but is not limited to, Dental fracture, dental caries, periapical abscess, gingival disease, peritonsillar abscess, ludwig's angina, malignancy. This is not an exhaustive differential.   Past Medical History / Co-morbidities / Social History: Tubal ligation, obesity  Additional history: Chart reviewed. Pertinent results include:   Last seen in emergency department on 02/02/2024 for influenza A.  Noted to have had elevated blood pressures in the past.  Lab Tests/Imaging studies: I personally interpreted labs/imaging and the pertinent results include:   CBC notes increased RBC of 5.46 but otherwise unremarkable BMP shows a mild decrease CO2 of 21 but otherwise unremarkable hCG quantitative undetectable  CT head unremarkable. I agree with the radiologist interpretation.    Medications: I ordered medication including  hydralazine , Toradol , Augmentin , LR, Reglan .  I have reviewed the patients home medicines and have made adjustments as needed.  Critical Interventions: none  Social Determinants of Health: Notably has had health insurance issues in the past, no dental insurance additionally noting that she needs a new PCP.  Disposition: After consideration of the diagnostic results and the patients response to treatment, I feel that the patient would benefit from discharge return as above.   emergency department workup does not suggest an emergent condition requiring admission or immediate intervention beyond what has been performed at this time. The plan is: Follow-up with  dentistry, Augmentin  for dental infection, follow-up with PCP for blood pressure, return to ER for new or worsening symptoms. The patient is safe for discharge and has been instructed to return immediately for worsening symptoms, change in symptoms or any other concerns.   Final diagnoses:  Dental infection  Hypertensive urgency    ED Discharge Orders          Ordered    amLODipine  (NORVASC ) 5 MG tablet  Daily        12/26/24 2120    amoxicillin -clavulanate (AUGMENTIN ) 875-125 MG tablet  Every 12 hours        12/26/24 2120    naproxen  (NAPROSYN ) 500 MG tablet  2 times daily        12/26/24 2140               Beola Terrall RAMAN, [place] 12/26/24 2140  "

## 2024-12-26 NOTE — Discharge Instructions (Addendum)
 You are seen today for dental infection and hypertensive urgency, noting that your blood pressure was significantly elevated to the point where you will need likely medical control.  You will need to follow-up with your PCP for long-term management of your blood pressure, I have attached a PCP's information here for you to call and schedule appointment at their office.  Additionally you will need to follow-up with dentistry for long-term management of your dental infection.  I have sent in a 10-day course of antibiotics, providing your first dose tonight, additionally sending you home with a 30-day supply of your blood pressure medication.  Please take Naprosyn , 500mg  by mouth twice daily as needed for pain - this in an antiinflammatory medicine (NSAID) and is similar to ibuprofen  - many people feel that it is stronger than ibuprofen  and it is easier to take since it is a smaller pill.  Please use this only for 1 week - if your pain persists, you will need to follow up with your doctor in the office for ongoing guidance and pain control.     Monitor symptoms, if you begin to have any new or worsening symptoms including worsening headache with visual changes, fever, persistent vomiting, shortness of breath, please return to the ER for further evaluation.
